# Patient Record
Sex: Male | Born: 1990 | Race: Black or African American | Hispanic: No | Marital: Married | Smoking: Never smoker
Health system: Southern US, Community
[De-identification: ages and names within clinical notes are randomized; demographics above are authoritative.]

## PROBLEM LIST (undated history)

## (undated) DIAGNOSIS — K2100 Gastro-esophageal reflux disease with esophagitis, without bleeding: Secondary | ICD-10-CM

## (undated) DIAGNOSIS — I10 Essential (primary) hypertension: Secondary | ICD-10-CM

## (undated) DIAGNOSIS — L309 Dermatitis, unspecified: Secondary | ICD-10-CM

## (undated) HISTORY — PX: KNEE ARTHROSCOPY: SHX127

## (undated) HISTORY — PX: HAND SURGERY: SHX662

---
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
## 2010-04-11 ENCOUNTER — Emergency Department (HOSPITAL_BASED_OUTPATIENT_CLINIC_OR_DEPARTMENT_OTHER)
Admission: EM | Admit: 2010-04-11 | Discharge: 2010-04-11 | Payer: Self-pay | Source: Home / Self Care | Admitting: Emergency Medicine

## 2010-09-07 NOTE — Op Note (Signed)
   NAME:  Gordon Taylor, Gordon Taylor                           ACCOUNT NO.:  [id number]   MEDICAL RECORD NO.:  [id number]                   PATIENT TYPE:  AMB   LOCATION:  [facility]                                  FACILITY:  [facility]   PHYSICIAN:  Artist Pais. Mina Marble, M.D.           DATE OF BIRTH:  09-09-1990   DATE OF PROCEDURE:  02/10/2002  DATE OF DISCHARGE:                                 OPERATIVE REPORT   PREOPERATIVE DIAGNOSIS:  Right fifth proximal phalangeal fracture.   POSTOPERATIVE DIAGNOSES:  Right fifth proximal phalangeal fracture.   PROCEDURE:  Closed reduction with percutaneous pinning of the above  fracture, using two #0.35 K-wires.   SURGEON:  Artist Pais. Mina Marble, M.D.   ASSISTANT:  Junius Roads. Bobbe Medico.   ANESTHESIA:  General.   TOURNIQUET TIME:  40 minutes.   COMPLICATIONS:  None.   DRAINS:  None.   DESCRIPTION OF PROCEDURE:  The patient was taken to the operating room.  Conduction adequate.  General anesthesia.  The right upper extremity was  prepped and draped in the usual sterile fashion.  The arm was elevated for 2-  1/2 minutes, and the tourniquet was then elevated to 225 mmHg. At this point  in time a closed reduction was performed of a displaced spiral fracture of  the right fifth proximal phalanx.  Once reduction was achieved, two #0.35 K-  wires were driven from proximal and distal in a crossed fashion across the  fracture site, thus securing the fracture in a reduced position.  The x-rays  intraoperatively showed a good reduction in the AP, lateral and oblique  views.  The K-wires were bent outside the skin.  The patient was then placed  in a sterile dressing of Xeroform, 4 x 4s, and an ulnar gutter splint.  The  patient also had 0.25% Marcaine injection for postoperative pain control.  The patient tolerated the procedure well and was taken to the recovery room  in stable condition.                                                Artist Pais Mina Marble, M.D.    MAW/MEDQ  D:  02/10/2002  T:  02/10/2002  Job:  [id number]

## 2013-01-11 ENCOUNTER — Encounter (HOSPITAL_BASED_OUTPATIENT_CLINIC_OR_DEPARTMENT_OTHER): Payer: Self-pay | Admitting: *Deleted

## 2013-01-11 ENCOUNTER — Emergency Department (HOSPITAL_BASED_OUTPATIENT_CLINIC_OR_DEPARTMENT_OTHER)
Admission: EM | Admit: 2013-01-11 | Discharge: 2013-01-11 | Disposition: A | Discharge status: Home / Self Care | Payer: No Typology Code available for payment source | Attending: Emergency Medicine | Admitting: Emergency Medicine

## 2013-01-11 ENCOUNTER — Emergency Department (HOSPITAL_BASED_OUTPATIENT_CLINIC_OR_DEPARTMENT_OTHER): Payer: No Typology Code available for payment source

## 2013-01-11 DIAGNOSIS — Y92410 Unspecified street and highway as the place of occurrence of the external cause: Secondary | ICD-10-CM | POA: Insufficient documentation

## 2013-01-11 DIAGNOSIS — S0003XA Contusion of scalp, initial encounter: Secondary | ICD-10-CM | POA: Insufficient documentation

## 2013-01-11 DIAGNOSIS — Y9389 Activity, other specified: Secondary | ICD-10-CM | POA: Insufficient documentation

## 2013-01-11 DIAGNOSIS — S0083XA Contusion of other part of head, initial encounter: Secondary | ICD-10-CM

## 2013-01-11 DIAGNOSIS — IMO0002 Reserved for concepts with insufficient information to code with codable children: Secondary | ICD-10-CM | POA: Insufficient documentation

## 2013-01-11 DIAGNOSIS — S6392XA Sprain of unspecified part of left wrist and hand, initial encounter: Secondary | ICD-10-CM

## 2013-01-11 DIAGNOSIS — Z872 Personal history of diseases of the skin and subcutaneous tissue: Secondary | ICD-10-CM | POA: Insufficient documentation

## 2013-01-11 DIAGNOSIS — S6390XA Sprain of unspecified part of unspecified wrist and hand, initial encounter: Secondary | ICD-10-CM | POA: Insufficient documentation

## 2013-01-11 HISTORY — DX: Dermatitis, unspecified: L30.9

## 2013-01-11 MED ORDER — IBUPROFEN 800 MG PO TABS: 800.0000 mg | ORAL_TABLET | Freq: Once | ORAL | Status: DC

## 2013-01-11 MED ORDER — CYCLOBENZAPRINE HCL 10 MG PO TABS: 10.0000 mg | ORAL_TABLET | Freq: Three times a day (TID) | ORAL | Status: DC | PRN

## 2013-01-11 MED ORDER — IBUPROFEN 800 MG PO TABS
800.0000 mg | ORAL_TABLET | Freq: Once | ORAL | Status: AC
  Administered 2013-01-11: 800 mg via ORAL
  Filled 2013-01-11: qty 1

## 2013-01-11 MED ORDER — CYCLOBENZAPRINE HCL 10 MG PO TABS
10.0000 mg | ORAL_TABLET | Freq: Once | ORAL | Status: DC
  Filled 2013-01-11: qty 1
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
## 2013-01-11 NOTE — ED Provider Notes (Signed)
Medical screening examination/treatment/procedure(s) were performed by non-physician practitioner and as supervising physician I was immediately available for consultation/collaboration.   Paidyn Mcferran, MD 01/11/13 1934 
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
## 2013-09-06 DIAGNOSIS — M1710 Unilateral primary osteoarthritis, unspecified knee: Secondary | ICD-10-CM | POA: Insufficient documentation

## 2014-03-07 DIAGNOSIS — L440 Pityriasis rubra pilaris: Secondary | ICD-10-CM | POA: Insufficient documentation

## 2014-06-18 ENCOUNTER — Emergency Department (HOSPITAL_BASED_OUTPATIENT_CLINIC_OR_DEPARTMENT_OTHER)
Admission: EM | Admit: 2014-06-18 | Discharge: 2014-06-18 | Disposition: A | Discharge status: Home / Self Care | Payer: BLUE CROSS/BLUE SHIELD | Attending: Emergency Medicine | Admitting: Emergency Medicine

## 2014-06-18 ENCOUNTER — Encounter (HOSPITAL_BASED_OUTPATIENT_CLINIC_OR_DEPARTMENT_OTHER): Payer: Self-pay | Admitting: *Deleted

## 2014-06-18 DIAGNOSIS — L440 Pityriasis rubra pilaris: Secondary | ICD-10-CM | POA: Diagnosis not present

## 2014-06-18 DIAGNOSIS — G44209 Tension-type headache, unspecified, not intractable: Secondary | ICD-10-CM

## 2014-06-18 DIAGNOSIS — R51 Headache: Secondary | ICD-10-CM | POA: Insufficient documentation

## 2014-06-18 MED ORDER — METOCLOPRAMIDE HCL 10 MG PO TABS: 10.0000 mg | ORAL_TABLET | Freq: Four times a day (QID) | ORAL | Status: DC | PRN

## 2014-06-18 MED ORDER — METOCLOPRAMIDE HCL 5 MG/ML IJ SOLN
10.0000 mg | Freq: Once | INTRAMUSCULAR | Status: AC
  Administered 2014-06-18: 10 mg via INTRAVENOUS
  Filled 2014-06-18: qty 2

## 2014-06-18 MED ORDER — SODIUM CHLORIDE 0.9 % IV SOLN
1000.0000 mL | Freq: Once | INTRAVENOUS | Status: AC
  Administered 2014-06-18: 1000 mL via INTRAVENOUS

## 2014-06-18 MED ORDER — SODIUM CHLORIDE 0.9 % IV SOLN: 1000.0000 mL | INTRAVENOUS | Status: DC

## 2014-06-18 MED ORDER — DIPHENHYDRAMINE HCL 50 MG/ML IJ SOLN
25.0000 mg | Freq: Once | INTRAMUSCULAR | Status: AC
  Administered 2014-06-18: 25 mg via INTRAVENOUS
  Filled 2014-06-18: qty 1

## 2014-06-18 NOTE — ED Provider Notes (Signed)
CSN: [id number]     Arrival date & time 06/18/14  0359 History   First MD Initiated Contact with Patient 06/18/14 [phone]     Chief Complaint  Patient presents with  . Headache     (Consider location/radiation/quality/duration/timing/severity/associated sxs/prior Treatment) Patient is a 24 y.o. male presenting with headaches. The history is provided by the patient.  Headache He comes in because of a headache which was present throughout the day yesterday, and then woke him up at 1 AM. Headache was severe and he rated it at 10/10 at its worst. It is located across the frontal region. He took 4 ibuprofen and headache has improved to where it is now only 6/10. Headache did seem to be worse when he was up and walking. There is no photophobia or phonophobia. He denies any visual change. He denies any nausea or vomiting and there is been no fever or chills. 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  Past Medical History  Diagnosis Date  . Eczema    Past Surgical History  Procedure Laterality Date  . Knee arthroscopy     No family history on file. History  Substance Use Topics  . Smoking status: Never Smoker   . Smokeless tobacco: Not on file  . Alcohol Use: No    Review of Systems  Neurological: Positive for headaches.  All other systems reviewed and are negative.     Allergies  Review of patient's allergies indicates no known allergies.  Home Medications   Prior to Admission medications   Medication Sig Start Date End Date Taking? Authorizing Provider  cyclobenzaprine (FLEXERIL) 10 MG tablet Take 1 tablet (10 mg total) by mouth 3 (three) times daily as needed for muscle spasms. 01/11/13   Shari A Upstill, PA-C  ibuprofen (ADVIL,MOTRIN) 800 MG tablet Take 1 tablet (800 mg total) by mouth once. 01/11/13   Shari A Upstill, PA-C   BP 148/71 mmHg  Pulse 77  Temp(Src) 97.8 F (36.6 C) (Oral)  Resp 20  Ht  (1.803 m)  Wt 285 lb (129.275  kg)  BMI 39.77 kg/m2  SpO2 99% Physical Exam  Nursing note and vitals reviewed.  24 year old male, resting comfortably and in no acute distress. Vital signs are significant for mild hypertension. Oxygen saturation is 99%, which is normal. Head is normocephalic and atraumatic. PERRLA, EOMI. Oropharynx is clear. Fundi show no hemorrhage, exudate, or papilledema. There is moderate tenderness to palpation of the temporalis muscles bilaterally. Neck is nontender and supple without adenopathy or JVD. Back is nontender and there is no CVA tenderness. Lungs are clear without rales, wheezes, or rhonchi. Chest is nontender. Heart has regular rate and rhythm without murmur. Abdomen is soft, flat, nontender without masses or hepatosplenomegaly and peristalsis is normoactive. Extremities have no cyanosis or pitting edema, full range of motion is present. Skin is warm and dry. There is erythema and slight swelling of the hands with some desquamation. 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 Neurologic: Mental status is normal, cranial nerves are intact, there are no motor or sensory deficits.  ED Course  Procedures (including critical care time)  MDM   Final diagnoses:  Muscle contraction headache  Pityriasis rubra pilaris    Headache of uncertain cause. No red flags to indicate serious causes of headache. He will be treated with a migraine cocktail of IV fluids, metoclopramide, and diphenhydramine and reassessed.  He had excellent relief of his headache with  above noted treatment. He is discharged with prescription for metoclopramide. He is advised of interaction between NSAIDs and methotrexate, and advised to use acetaminophen for pain rather than NSAIDs.  Dione Boozeavid Kito Cuffe, MD 06/18/14 [phone]

## 2014-06-18 NOTE — ED Notes (Signed)
MD at bedside. 

## 2014-06-18 NOTE — Discharge Instructions (Signed)
Do not take ibuprofen or naproxen - they can raise the level of methotrexate in your blood stream.  Tension Headache A tension headache is a feeling of pain, pressure, or aching often felt over the front and sides of the head. The pain can be dull or can feel tight (constricting). It is the most common type of headache. Tension headaches are not normally associated with nausea or vomiting and do not get worse with physical activity. Tension headaches can last 30 minutes to several days.  CAUSES  The exact cause is not known, but it may be caused by chemicals and hormones in the brain that lead to pain. Tension headaches often begin after stress, anxiety, or depression. Other triggers may include:  Alcohol.  Caffeine (too much or withdrawal).  Respiratory infections (colds, flu, sinus infections).  Dental problems or teeth clenching.  Fatigue.  Holding your head and neck in one position too long while using a computer. SYMPTOMS   Pressure around the head.   Dull, aching head pain.   Pain felt over the front and sides of the head.   Tenderness in the muscles of the head, neck, and shoulders. DIAGNOSIS  A tension headache is often diagnosed based on:   Symptoms.   Physical examination.   A CT scan or MRI of your head. These tests may be ordered if symptoms are severe or unusual. TREATMENT  Medicines may be given to help relieve symptoms.  HOME CARE INSTRUCTIONS   Only take over-the-counter or prescription medicines for pain or discomfort as directed by your caregiver.   Lie down in a dark, quiet room when you have a headache.   Keep a journal to find out what may be triggering your headaches. For example, write down:  What you eat and drink.  How much sleep you get.  Any change to your diet or medicines.  Try massage or other relaxation techniques.   Ice packs or heat applied to the head and neck can be used. Use these 3 to 4 times per day for 15 to 20  minutes each time, or as needed.   Limit stress.   Sit up straight, and do not tense your muscles.   Quit smoking if you smoke.  Limit alcohol use.  Decrease the amount of caffeine you drink, or stop drinking caffeine.  Eat and exercise regularly.  Get 7 to 9 hours of sleep, or as recommended by your caregiver.  Avoid excessive use of pain medicine as recurrent headaches can occur.  SEEK MEDICAL CARE IF:   You have problems with the medicines you were prescribed.  Your medicines do not work.  You have a change from the usual headache.  You have nausea or vomiting. SEEK IMMEDIATE MEDICAL CARE IF:   Your headache becomes severe.  You have a fever.  You have a stiff neck.  You have loss of vision.  You have muscular weakness or loss of muscle control.  You lose your balance or have trouble walking.  You feel faint or pass out.  You have severe symptoms that are different from your first symptoms. MAKE SURE YOU:   Understand these instructions.  Will watch your condition.  Will get help right away if you are not doing well or get worse. Document Released: 04/08/2005 Document Revised: 07/01/2011 Document Reviewed: 03/29/2011 [facility] Patient Information 2015 [place], [place]. This information is not intended to replace advice given to you by your health care provider. Make sure you discuss any questions you  have with your health care provider.  Metoclopramide tablets What is this medicine? METOCLOPRAMIDE (met oh kloe PRA mide) is used to treat the symptoms of gastroesophageal reflux disease (GERD) like heartburn. It is also used to treat people with slow emptying of the stomach and intestinal tract. This medicine may be used for other purposes; ask your health care provider or pharmacist if you have questions. COMMON BRAND NAME(S): Reglan What should I tell my health care provider before I take this medicine? They need to know if you have any of these  conditions: -breast cancer -depression -diabetes -heart failure -high blood pressure -kidney disease -liver disease -Parkinson's disease or a movement disorder -pheochromocytoma -seizures -stomach obstruction, bleeding, or perforation -an unusual or allergic reaction to metoclopramide, procainamide, sulfites, other medicines, foods, dyes, or preservatives -pregnant or trying to get pregnant -breast-feeding How should I use this medicine? Take this medicine by mouth with a glass of water. Follow the directions on the prescription label. Take this medicine on an empty stomach, about 30 minutes before eating. Take your doses at regular intervals. Do not take your medicine more often than directed. Do not stop taking except on the advice of your doctor or health care professional. A special MedGuide will be given to you by the pharmacist with each prescription and refill. Be sure to read this information carefully each time. Talk to your pediatrician regarding the use of this medicine in children. Special care may be needed. Overdosage: If you think you have taken too much of this medicine contact a poison control center or emergency room at once. NOTE: This medicine is only for you. Do not share this medicine with others. What if I miss a dose? If you miss a dose, take it as soon as you can. If it is almost time for your next dose, take only that dose. Do not take double or extra doses. What may interact with this medicine? -acetaminophen -cyclosporine -digoxin -medicines for blood pressure -medicines for diabetes, including insulin -medicines for hay fever and other allergies -medicines for depression, especially an Monoamine Oxidase Inhibitor (MAOI) -medicines for Parkinson's disease, like levodopa -medicines for sleep or for pain -tetracycline This list may not describe all possible interactions. Give your health care provider a list of all the medicines, herbs, non-prescription  drugs, or dietary supplements you use. Also tell them if you smoke, drink alcohol, or use illegal drugs. Some items may interact with your medicine. What should I watch for while using this medicine? It may take a few weeks for your stomach condition to start to get better. However, do not take this medicine for longer than 12 weeks. The longer you take this medicine, and the more you take it, the greater your chances are of developing serious side effects. If you are an elderly patient, a male patient, or you have diabetes, you may be at an increased risk for side effects from this medicine. Contact your doctor immediately if you start having movements you cannot control such as lip smacking, rapid movements of the tongue, involuntary or uncontrollable movements of the eyes, head, arms and legs, or muscle twitches and spasms. Patients and their families should watch out for worsening depression or thoughts of suicide. Also watch out for any sudden or severe changes in feelings such as feeling anxious, agitated, panicky, irritable, hostile, aggressive, impulsive, severely restless, overly excited and hyperactive, or not being able to sleep. If this happens, especially at the beginning of treatment or after a change in  dose, call your doctor. Do not treat yourself for high fever. Ask your doctor or health care professional for advice. You may get drowsy or dizzy. Do not drive, use machinery, or do anything that needs mental alertness until you know how this drug affects you. Do not stand or sit up quickly, especially if you are an older patient. This reduces the risk of dizzy or fainting spells. Alcohol can make you more drowsy and dizzy. Avoid alcoholic drinks. What side effects may I notice from receiving this medicine? Side effects that you should report to your doctor or health care professional as soon as possible: -allergic reactions like skin rash, itching or hives, swelling of the face, lips, or  tongue -abnormal production of milk in females -breast enlargement in both males and females -change in the way you walk -difficulty moving, speaking or swallowing -drooling, lip smacking, or rapid movements of the tongue -excessive sweating -fever -involuntary or uncontrollable movements of the eyes, head, arms and legs -irregular heartbeat or palpitations -muscle twitches and spasms -unusually weak or tired Side effects that usually do not require medical attention (report to your doctor or health care professional if they continue or are bothersome): -change in sex drive or performance -depressed mood -diarrhea -difficulty sleeping -headache -menstrual changes -restless or nervous This list may not describe all possible side effects. Call your doctor for medical advice about side effects. You may report side effects to FDA at 1-800-FDA-1088. Where should I keep my medicine? Keep out of the reach of children. Store at room temperature between 20 and 25 degrees C (68 and 77 degrees F). Protect from light. Keep container tightly closed. Throw away any unused medicine after the expiration date. NOTE: This sheet is a summary. It may not cover all possible information. If you have questions about this medicine, talk to your doctor, pharmacist, or health care provider.  2015, [employer]vier/[employer] Standard. (2011-08-06 13:04:38)

## 2014-06-18 NOTE — ED Notes (Signed)
Pt reports headache that started yesterday and woke him from his sleep tonight.

## 2016-03-21 ENCOUNTER — Ambulatory Visit: Payer: BLUE CROSS/BLUE SHIELD

## 2016-03-21 ENCOUNTER — Ambulatory Visit (INDEPENDENT_AMBULATORY_CARE_PROVIDER_SITE_OTHER): Payer: BLUE CROSS/BLUE SHIELD | Admitting: Physician Assistant

## 2016-03-21 VITALS — BP 162/86 | HR 118 | Temp 98.3°F | Resp 18 | Ht 72.6 in | Wt 345.1 lb

## 2016-03-21 DIAGNOSIS — H66003 Acute suppurative otitis media without spontaneous rupture of ear drum, bilateral: Secondary | ICD-10-CM | POA: Diagnosis not present

## 2016-03-21 DIAGNOSIS — R030 Elevated blood-pressure reading, without diagnosis of hypertension: Secondary | ICD-10-CM | POA: Diagnosis not present

## 2016-03-21 MED ORDER — AMOXICILLIN 875 MG PO TABS: 875.0000 mg | ORAL_TABLET | Freq: Two times a day (BID) | ORAL | 0 refills | Status: AC

## 2016-03-21 NOTE — Progress Notes (Signed)
Patient ID: Gordon Taylor, male     DOB: 05-13-1990, 25 y.o.    MRN: [id number]  PCP: Katy ApoPOLITE,RONALD D, MD  Chief Complaint  Patient presents with  . Nasal Congestion    X2 Days  . Tinnitus    Both ears X2 days    Subjective:   This patient is new to this patient and presents for evaluation of bilateral ear pain and hearing loss x 2 days.  Assoiated nasal/sinus congestion, fatigue/malaise, cough productive of yellow sputum and sore throat. Denies tinnitus. No SOB, CP, HA.  OTC Mucinex without relief.  Recent contact with a child with a viral URI. Has not received the seasonal influenza vaccine.  Review of Systems Constitutional: Positive for fatigue. Negative for appetite change, chills, diaphoresis and fever.  HENT: Positive for congestion, ear pain, hearing loss and sore throat. Negative for ear discharge, rhinorrhea, sinus pain, sinus pressure and tinnitus.   Eyes: Positive for redness. Negative for pain, discharge and itching.  Respiratory: Positive for cough. Negative for chest tightness, shortness of breath and wheezing.   Cardiovascular: Negative for chest pain and palpitations.  Gastrointestinal: Negative for abdominal pain, blood in stool, constipation, diarrhea, nausea and vomiting.  Genitourinary: Negative for difficulty urinating, dysuria, frequency, hematuria and urgency.  Neurological: Negative for dizziness, light-headedness and headaches.     Prior to Admission medications   Medication Sig Start Date End Date Taking? Authorizing Provider  RANITIDINE HCL PO Take by mouth.    Historical Provider, MD     No Known Allergies   Patient Active Problem List   Diagnosis Date Noted  . 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 Primary localized osteoarthrosis, lower leg 09/06/2013     Family History  Problem Relation Age of Onset  . Diabetes Father      Social History   Social History  . Marital status: Single   Spouse name: N/A  . Number of children: N/A  . Years of education: N/A   Occupational History  . Not on file.   Social History Main Topics  . Smoking status: Never Smoker  . Smokeless tobacco: Never Used  . Alcohol use No  . Drug use: No  . Sexual activity: No   Other Topics Concern  . Not on file   Social History Narrative  . No narrative on file         Objective:  Physical Exam  Constitutional: He is oriented to person, place, and time. He appears well-developed and well-nourished. He is active and cooperative. No distress.  BP (!) 162/86   Pulse (!) 118   Temp 98.3 F (36.8 C) (Oral)   Resp 18   Ht 6' 0.6" (1.844 m)   Wt (!) 345 lb 1.3 oz (156.5 kg)   SpO2 99%   BMI 46.03 kg/m  Repeat pulse is 76   HENT:  Head: Normocephalic and atraumatic.  Right Ear: External ear and ear canal normal. No lacerations. No drainage, swelling or tenderness. No foreign bodies. Tympanic membrane is injected and bulging. No hemotympanum. Decreased hearing is noted.  Left Ear: External ear and ear canal normal. No lacerations. No drainage, swelling or tenderness. No foreign bodies. Tympanic membrane is injected and bulging. No hemotympanum. Decreased hearing is noted.  Eyes: Conjunctivae are normal.  Pulmonary/Chest: Effort normal.  Neurological: He is alert and oriented to person, place, and time.  Psychiatric: He has a normal mood and affect. His speech is normal and behavior is  normal.             Assessment & Plan:  1. Acute suppurative otitis media of both ears without spontaneous rupture of tympanic membranes, recurrence not specified Amoxicillin. Rest. Hydrate. Anticipatory guidance.  2. Elevated BP without diagnosis of hypertension RTC 1-2 weeks for re-evaluation. If remains elevated, plan EKG, CBC, CMET, UA dip+micro, TSH and initiation of antihypertensive.   Fernande Brashelle S. Teagen Mcleary, PA-C Physician Assistant-Certified [facility]

## 2016-03-21 NOTE — Patient Instructions (Addendum)
     IF you received an x-ray today, you will receive an invoice from [facility]. Please contact  Radiology at [phone] with questions or concerns regarding your invoice.   IF you received labwork today, you will receive an invoice from [facility]. Please contact [facility] at [phone] with questions or concerns regarding your invoice.   Our billing staff will not be able to assist you with questions regarding bills from these companies.  You will be contacted with the lab results as soon as they are available. The fastest way to get your results is to activate your My Chart account. Instructions are located on the last page of this paperwork. If you have not heard from us regarding the results in 2 weeks, please contact this office.      Otitis Media, Adult Otitis media is redness, soreness, and inflammation of the middle ear. Otitis media may be caused by allergies or, most commonly, by infection. Often it occurs as a complication of the common cold. What are the signs or symptoms? Symptoms of otitis media may include:  Earache.  Fever.  Ringing in your ear.  Headache.  Leakage of fluid from the ear. How is this diagnosed? To diagnose otitis media, your health care provider will examine your ear with an otoscope. This is an instrument that allows your health care provider to see into your ear in order to examine your eardrum. Your health care provider also will ask you questions about your symptoms. How is this treated? Typically, otitis media resolves on its own within 3-5 days. Your health care provider may prescribe medicine to ease your symptoms of pain. If otitis media does not resolve within 5 days or is recurrent, your health care provider may prescribe antibiotic medicines if he or she suspects that a bacterial infection is the cause. Follow these instructions at home:  If you were prescribed an antibiotic medicine,  finish it all even if you start to feel better.  Take medicines only as directed by your health care provider.  Keep all follow-up visits as directed by your health care provider. Contact a health care provider if:  You have otitis media only in one ear, or bleeding from your nose, or both.  You notice a lump on your neck.  You are not getting better in 3-5 days.  You feel worse instead of better. Get help right away if:  You have pain that is not controlled with medicine.  You have swelling, redness, or pain around your ear or stiffness in your neck.  You notice that part of your face is paralyzed.  You notice that the bone behind your ear (mastoid) is tender when you touch it. This information is not intended to replace advice given to you by your health care provider. Make sure you discuss any questions you have with your health care provider. Document Released: 01/12/2004 Document Revised: 09/14/2015 Document Reviewed: 11/03/2012 Elsevier Interactive Patient Education  2017 [employer]  

## 2016-03-21 NOTE — Progress Notes (Signed)
Subjective:    Patient ID: Gordon Taylor, male    DOB: May 28, 1990, 25 y.o.   MRN: [id number]  Chief Complaint  Patient presents with  . Nasal Congestion    X2 Days  . Tinnitus    Both ears X2 days   HPI: Presents for symptoms which began 2 days ago with nasal congestion. States it began in his right nare in the morning and progressed to both nares being congestion by that evening. States yesterday morning he woke up with fullness in his ears causing difficulty hearing. Denies ear pain, ear discharge or tinnitus. Associated symptoms include malaise, fatigue, dry mouth, productive cough with yellow sputum which began this morning and sore throat. Notes some increased redness of his eyes but denies itching or discharge. Denies fevers, chills, headaches,  SOB, wheezing, chest pain or tightness, or palpitations. States he has been using Mucinex at home which has provided a little relief. Denies receiving flu vaccine. States he was recently around a child who had a cold. Denies smoking history.  Denies abdominal pain, nausea, vomiting, diarrhea, or constipation. Denies urinary complaints.   Review of Systems  Constitutional: Positive for fatigue. Negative for appetite change, chills, diaphoresis and fever.  HENT: Positive for congestion, ear pain, hearing loss and sore throat. Negative for ear discharge, rhinorrhea, sinus pain, sinus pressure and tinnitus.   Eyes: Positive for redness. Negative for pain, discharge and itching.  Respiratory: Positive for cough. Negative for chest tightness, shortness of breath and wheezing.   Cardiovascular: Negative for chest pain and palpitations.  Gastrointestinal: Negative for abdominal pain, blood in stool, constipation, diarrhea, nausea and vomiting.  Genitourinary: Negative for difficulty urinating, dysuria, frequency, hematuria and urgency.  Neurological: Negative for dizziness, light-headedness and headaches.   No Known Allergies  There are no active  problems to display for this patient.  Prior to Admission medications   Not on File       Objective: Blood pressure (!) 160/80, pulse (!) 118, temperature 98.3 F (36.8 C), temperature source Oral, resp. rate 18, height 6' 0.6" (1.844 m), weight (!) 345 lb 1.3 oz (156.5 kg), SpO2 99 %.   Physical Exam  Constitutional: He is oriented to person, place, and time. He appears well-developed and well-nourished. No distress.  HENT:  Head: Normocephalic and atraumatic.  Right Ear: External ear normal. No drainage, swelling or tenderness. No mastoid tenderness. Tympanic membrane is injected and bulging. Tympanic membrane is not scarred, not perforated, not erythematous and not retracted. No middle ear effusion. No hemotympanum.  Left Ear: External ear normal. No drainage, swelling or tenderness. No mastoid tenderness. Tympanic membrane is injected and bulging. Tympanic membrane is not scarred, not perforated, not erythematous and not retracted.  No middle ear effusion. No hemotympanum.  Mouth/Throat: No oropharyngeal exudate.  Bilateral opaque TMs.  Eyes: Conjunctivae are normal. Pupils are equal, round, and reactive to light. Right eye exhibits no discharge. Left eye exhibits no discharge. Right conjunctiva is not injected. Left conjunctiva is not injected. No scleral icterus. Right pupil is round and reactive. Left pupil is round and reactive. Pupils are equal.  Neck: Normal range of motion. Neck supple. No tracheal tenderness and no spinous process tenderness present. No tracheal deviation and no edema present. No thyromegaly present.  Cardiovascular: Normal rate, regular rhythm, S1 normal, S2 normal, normal heart sounds and intact distal pulses.  Exam reveals no gallop, no distant heart sounds and no friction rub.   No murmur heard. Pulses:  Radial pulses are 2+ on the right side, and 2+ on the left side.  Pulmonary/Chest: Effort normal and breath sounds normal. No accessory muscle usage or  stridor. No respiratory distress. He has no decreased breath sounds. He has no wheezes. He has no rhonchi. He has no rales.  Lymphadenopathy:       Head (right side): Submandibular adenopathy present. No submental, no tonsillar, no preauricular and no posterior auricular adenopathy present.       Head (left side): Submandibular adenopathy present. No submental, no tonsillar, no preauricular and no posterior auricular adenopathy present.    He has cervical adenopathy.  Neurological: He is alert and oriented to person, place, and time.  Skin: Skin is warm and dry. No rash noted. He is not diaphoretic. No erythema.     Psychiatric: He has a normal mood and affect. His behavior is normal.       Assessment & Plan:  1. Acute suppurative otitis media of both ears without spontaneous rupture of tympanic membranes, recurrence not specified Rx Amoxicillin for bilateral AOM. Recommended rest and fluids. Advised patient to RTC if symptoms worsen or do not improve.  2. Elevated BP without diagnosis of hypertension Recheck of blood pressure measurement revealed 162/98 mmHg. Pulse decreased from 118 bpm to 78 bpm on re-evaluation. Elevated BP likely due to illness. Advised patient to RTC in 1 week to re-check blood pressure.

## 2016-03-22 ENCOUNTER — Telehealth: Payer: Self-pay

## 2016-03-22 NOTE — Telephone Encounter (Signed)
Pt came into clinic requesting note for work through today as symptoms have not gotten any better.

## 2016-03-22 NOTE — Progress Notes (Signed)
Patient walked in still feeling ill and asked for a work note for today. Given by me based on ov note.

## 2016-03-28 ENCOUNTER — Ambulatory Visit: Payer: BLUE CROSS/BLUE SHIELD

## 2017-03-09 ENCOUNTER — Encounter (HOSPITAL_BASED_OUTPATIENT_CLINIC_OR_DEPARTMENT_OTHER): Payer: Self-pay | Admitting: *Deleted

## 2017-03-09 ENCOUNTER — Emergency Department (HOSPITAL_BASED_OUTPATIENT_CLINIC_OR_DEPARTMENT_OTHER): Payer: BLUE CROSS/BLUE SHIELD

## 2017-03-09 ENCOUNTER — Other Ambulatory Visit: Payer: Self-pay

## 2017-03-09 ENCOUNTER — Emergency Department (HOSPITAL_BASED_OUTPATIENT_CLINIC_OR_DEPARTMENT_OTHER)
Admission: EM | Admit: 2017-03-09 | Discharge: 2017-03-09 | Disposition: A | Discharge status: Home / Self Care | Payer: BLUE CROSS/BLUE SHIELD | Attending: Emergency Medicine | Admitting: Emergency Medicine

## 2017-03-09 DIAGNOSIS — G44209 Tension-type headache, unspecified, not intractable: Secondary | ICD-10-CM | POA: Insufficient documentation

## 2017-03-09 DIAGNOSIS — Z79899 Other long term (current) drug therapy: Secondary | ICD-10-CM | POA: Insufficient documentation

## 2017-03-09 DIAGNOSIS — R51 Headache: Secondary | ICD-10-CM | POA: Diagnosis present

## 2017-03-09 HISTORY — DX: Gastro-esophageal reflux disease with esophagitis: K21.0

## 2017-03-09 HISTORY — DX: Gastro-esophageal reflux disease with esophagitis, without bleeding: K21.00

## 2017-03-09 MED ORDER — KETOROLAC TROMETHAMINE 60 MG/2ML IM SOLN
60.0000 mg | Freq: Once | INTRAMUSCULAR | Status: AC
  Administered 2017-03-09: 60 mg via INTRAMUSCULAR
  Filled 2017-03-09: qty 2

## 2017-03-09 MED ORDER — BUTALBITAL-APAP-CAFFEINE 50-325-40 MG PO TABS: 1.0000 | ORAL_TABLET | Freq: Three times a day (TID) | ORAL | 0 refills | Status: AC | PRN

## 2017-03-09 MED ORDER — CYCLOBENZAPRINE HCL 10 MG PO TABS
10.0000 mg | ORAL_TABLET | Freq: Once | ORAL | Status: AC
Start: 2017-03-09 — End: 2017-03-09
  Administered 2017-03-09: 10 mg via ORAL
  Filled 2017-03-09: qty 1

## 2017-03-09 MED ORDER — CYCLOBENZAPRINE HCL 10 MG PO TABS: 10.0000 mg | ORAL_TABLET | Freq: Three times a day (TID) | ORAL | 0 refills | Status: DC | PRN

## 2017-03-09 NOTE — Discharge Instructions (Signed)
Try the Flexeril to help with muscle tension headaches.  If your headache worsens, you can take the Fioricet. Please note that this can be sedating, so do not take this while working, operating heavy machinery, or driving.

## 2017-03-09 NOTE — ED Triage Notes (Signed)
Patient reports a five day history of headache.  States he has taken ibuprofen with minimal relief.

## 2017-03-09 NOTE — ED Provider Notes (Signed)
[facility] EMERGENCY DEPARTMENT Provider Note   CSN: [id number] Arrival date & time: 03/09/17  [id number]     History   Chief Complaint Chief Complaint  Patient presents with  . Headache    HPI Devian Evern Bio Pewitt is a 26 y.o. male.  HPI   26 year old male with past medical history of obesity who presents with headache.  The patient states that over the last 5 days, he has had gradual onset of progressively worsening frontal headache.  He describes it as an aching, throbbing sensation.  It is worse in the mornings and then improves throughout the day.  He does have a history of suspected sleep apnea, but has not had the results of his recent sleep test.  He denies any fevers or chills.  No radiation to the neck.  No photophobia.  He does feel mild pressure in his eyes and his notices blood pressures been elevated.  Denies any chest pain or shortness of breath.  No focal numbness or weakness.  No night loss or weight sweats.  He does have a history of tension headache, but states this is worse than his usual headache.  Past Medical History:  Diagnosis Date  . Eczema   . Reflux esophagitis     Patient Active Problem List   Diagnosis Date Noted  . 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 Primary localized osteoarthrosis, lower leg 09/06/2013    Past Surgical History:  Procedure Laterality Date  . HAND SURGERY    . KNEE ARTHROSCOPY         Home Medications    Prior to Admission medications   Medication Sig Start Date End Date Taking? Authorizing Provider  ibuprofen (ADVIL,MOTRIN) 600 MG tablet Take 600 mg every 6 (six) hours as needed by mouth.   Yes [provider]  butalbital-acetaminophen-caffeine (FIORICET, ESGIC) 50-325-40 MG tablet Take 1-2 tablets every 8 (eight) hours as needed by mouth for headache. 03/09/17 03/09/18  Shaune PollackIsaacs, Ziyad Dyar, MD  cyclobenzaprine (FLEXERIL) 10 MG tablet Take 1 tablet (10 mg total) 3 (three) times  daily as needed by mouth for muscle spasms (with headache). 03/09/17   Shaune PollackIsaacs, Laconda Basich, MD  RANITIDINE HCL PO Take by mouth.    [provider]    Family History Family History  Problem Relation Age of Onset  . Diabetes Father     Social History Social History   Tobacco Use  . Smoking status: Never Smoker  . Smokeless tobacco: Never Used  Substance Use Topics  . Alcohol use: No  . Drug use: No     Allergies   Patient has no known allergies.   Review of Systems Review of Systems  Constitutional: Positive for fatigue. Negative for chills and fever.  HENT: Negative for congestion and rhinorrhea.   Eyes: Negative for visual disturbance.  Respiratory: Negative for cough, shortness of breath and wheezing.   Cardiovascular: Negative for chest pain and leg swelling.  Gastrointestinal: Negative for abdominal pain, diarrhea, nausea and vomiting.  Genitourinary: Negative for dysuria and flank pain.  Musculoskeletal: Negative for neck pain and neck stiffness.  Skin: Negative for rash and wound.  Allergic/Immunologic: Negative for immunocompromised state.  Neurological: Positive for headaches. Negative for syncope and weakness.  All other systems reviewed and are negative.    Physical Exam Updated Vital Signs BP (!) 160/104 (BP Location: Right Arm)   Pulse 100   Temp 98.1 F (36.7 C) (Oral)   Resp 20   Ht 6' (  1.829 m)   Wt (!) 158.8 kg (350 lb)   SpO2 99%   BMI 47.47 kg/m   Physical Exam  Constitutional: He is oriented to person, place, and time. He appears well-developed and well-nourished. No distress.  HENT:  Head: Normocephalic and atraumatic.  Eyes: Conjunctivae are normal.  Neck: Neck supple.  No meningismus.  Neck is supple.  Cardiovascular: Normal rate, regular rhythm and normal heart sounds. Exam reveals no friction rub.  No murmur heard. Pulmonary/Chest: Effort normal and breath sounds normal. No respiratory distress. He has no wheezes. He has no  rales.  Abdominal: He exhibits no distension.  Musculoskeletal: He exhibits no edema.  Neurological: He is alert and oriented to person, place, and time. He exhibits normal muscle tone.  Skin: Skin is warm. Capillary refill takes less than 2 seconds.  Psychiatric: He has a normal mood and affect.  Nursing note and vitals reviewed.   Neurological Exam:  Mental Status: Alert and oriented to person, place, and time. Attention and concentration normal. Speech clear. Recent memory is intact. Cranial Nerves: Visual fields grossly intact. EOMI and PERRLA. No nystagmus noted. Facial sensation intact at forehead, maxillary cheek, and chin/mandible bilaterally. No facial asymmetry or weakness. Hearing grossly normal. Uvula is midline, and palate elevates symmetrically. Normal SCM and trapezius strength. Tongue midline without fasciculations. Motor: Muscle strength 5/5 in proximal and distal UE and LE bilaterally. No pronator drift. Muscle tone normal. Reflexes: 2+ and symmetrical in all four extremities.  Sensation: Intact to light touch in upper and lower extremities distally bilaterally.  Gait: Normal without ataxia. Coordination: Normal FTN bilaterally.      ED Treatments / Results  Labs (all labs ordered are listed, but only abnormal results are displayed) Labs Reviewed - No data to display  EKG  EKG Interpretation None       Radiology Ct Head Wo Contrast  Result Date: 03/09/2017 CLINICAL DATA:  Headache, nausea. EXAM: CT HEAD WITHOUT CONTRAST TECHNIQUE: Contiguous axial images were obtained from the base of the skull through the vertex without intravenous contrast. COMPARISON:  None FINDINGS: Brain: No acute intracranial abnormality. Specifically, no hemorrhage, hydrocephalus, mass lesion, acute infarction, or significant intracranial injury. Vascular: No hyperdense vessel or unexpected calcification. Skull: No acute calvarial abnormality. Sinuses/Orbits: Visualized paranasal sinuses  and mastoids clear. Orbital soft tissues unremarkable. Other: None IMPRESSION: Normal study. Electronically Signed   By: Charlett Nose M.D.   On: 03/09/2017 08:44    Procedures Procedures (including critical care time)  Medications Ordered in ED Medications  ketorolac (TORADOL) injection 60 mg (60 mg Intramuscular Given 03/09/17 0804)  cyclobenzaprine (FLEXERIL) tablet 10 mg (10 mg Oral Given 03/09/17 0804)     Initial Impression / Assessment and Plan / ED Course  I have reviewed the triage vital signs and the nursing notes.  Pertinent labs & imaging results that were available during my care of the patient were reviewed by me and considered in my medical decision making (see chart for details).     26 yo obese male here with dull, aching, central pressure like headache in tension distribution. History of tension HA. 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 He has no focal neuro deficits to suggest CVA or intracranial mass. CT head is neg. HA began gradually and is not c/f SAH. No fever, photophobia, neck stiffness, or sx of meningitis or encephalitis. He does have a significant muscular component as well. Will trial flexeril, d/c with outpt follow-up. Encouraged him to f/u with a PCP re: his  HTN. BP improved with pain control here. No signs of HTN emergency.  Final Clinical Impressions(s) / ED Diagnoses   Final diagnoses:  Tension headache    ED Discharge Orders        Ordered    cyclobenzaprine (FLEXERIL) 10 MG tablet  3 times daily PRN     03/09/17 0845    butalbital-acetaminophen-caffeine (FIORICET, ESGIC) 50-325-40 MG tablet  Every 8 hours PRN     03/09/17 0845       Shaune PollackIsaacs, Kissy Cielo, MD 03/09/17 [phone]

## 2018-12-18 ENCOUNTER — Emergency Department: Payer: Federal, State, Local not specified - PPO

## 2018-12-18 ENCOUNTER — Encounter: Payer: Self-pay | Admitting: Emergency Medicine

## 2018-12-18 ENCOUNTER — Other Ambulatory Visit: Payer: Self-pay

## 2018-12-18 DIAGNOSIS — R0789 Other chest pain: Secondary | ICD-10-CM | POA: Insufficient documentation

## 2018-12-18 DIAGNOSIS — R739 Hyperglycemia, unspecified: Secondary | ICD-10-CM | POA: Insufficient documentation

## 2018-12-18 LAB — CBC
HCT: 38.6 % — ABNORMAL LOW (ref 39.0–52.0)
Hemoglobin: 12.8 g/dL — ABNORMAL LOW (ref 13.0–17.0)
MCH: 27.8 pg (ref 26.0–34.0)
MCHC: 33.2 g/dL (ref 30.0–36.0)
MCV: 83.7 fL (ref 80.0–100.0)
Platelets: 412 10*3/uL — ABNORMAL HIGH (ref 150–400)
RBC: 4.61 MIL/uL (ref 4.22–5.81)
RDW: 13.2 % (ref 11.5–15.5)
WBC: 9.6 10*3/uL (ref 4.0–10.5)
nRBC: 0 % (ref 0.0–0.2)

## 2018-12-18 LAB — BASIC METABOLIC PANEL
Anion gap: 12 (ref 5–15)
BUN: 12 mg/dL (ref 6–20)
CO2: 25 mmol/L (ref 22–32)
Calcium: 9.4 mg/dL (ref 8.9–10.3)
Chloride: 101 mmol/L (ref 98–111)
Creatinine, Ser: 0.74 mg/dL (ref 0.61–1.24)
GFR calc Af Amer: >60 mL/min (ref >60)
GFR calc non Af Amer: >60 mL/min (ref >60)
Glucose, Bld: 177 mg/dL — ABNORMAL HIGH (ref 70–99)
Potassium: 3.6 mmol/L (ref 3.5–5.1)
Sodium: 138 mmol/L (ref 135–145)

## 2018-12-18 LAB — TROPONIN I (HIGH SENSITIVITY): Troponin I (High Sensitivity): 8 ng/L (ref <18)

## 2018-12-18 NOTE — ED Triage Notes (Signed)
Patient with complaint of upper back pain radiating to his chest that started last night and become worse today. Patient states that he also has shortness of breath.

## 2018-12-19 ENCOUNTER — Other Ambulatory Visit: Payer: Self-pay

## 2018-12-19 ENCOUNTER — Emergency Department
Admission: EM | Admit: 2018-12-19 | Discharge: 2018-12-19 | Disposition: A | Discharge status: Home / Self Care | Payer: Federal, State, Local not specified - PPO | Attending: Emergency Medicine | Admitting: Emergency Medicine

## 2018-12-19 ENCOUNTER — Emergency Department: Payer: Federal, State, Local not specified - PPO

## 2018-12-19 DIAGNOSIS — R7309 Other abnormal glucose: Secondary | ICD-10-CM

## 2018-12-19 DIAGNOSIS — R079 Chest pain, unspecified: Secondary | ICD-10-CM

## 2018-12-19 LAB — TROPONIN I (HIGH SENSITIVITY): Troponin I (High Sensitivity): 8 ng/L (ref <18)

## 2018-12-19 MED ORDER — IOHEXOL 350 MG/ML SOLN
125.0000 mL | Freq: Once | INTRAVENOUS | Status: AC | PRN
  Administered 2018-12-19: 04:00:00 125 mL via INTRAVENOUS

## 2018-12-19 NOTE — ED Provider Notes (Signed)
[facility] Emergency Department Provider Note  ____________________________________________  Time seen: Approximately 2:45 AM  I have reviewed the triage vital signs and the nursing notes.   HISTORY  Chief Complaint Chest Pain   HPI Gordon Taylor is a 28 y.o. male with a history of morbidly obesity, eczema and pityriasis rubra who presents for evaluation of chest pain.  Patient reports the pain started last night and became worse today.  The pain started in the middle of his upper back and now radiates across to his chest.  The pain is sharp and present with deep inspiration.  Pain is moderate in intensity.  Associated with shortness of breath.   No personal or family history of heart attacks, PE or DVT, no recent travel immobilization, no leg pain or swelling, no hemoptysis, no exogenous hormones.  Patient does not take any medications at home.  Does not smoke.  Denies any drug use.  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 Authorizing Provider  cyclobenzaprine (FLEXERIL) 10 MG tablet Take 1 tablet (10 mg total) 3 (three) times daily as needed by mouth for muscle spasms (with headache). 03/09/17   Shaune Pollack, MD  ibuprofen (ADVIL,MOTRIN) 600 MG tablet Take 600 mg every 6 (six) hours as needed by mouth.    [provider]  RANITIDINE HCL PO Take by mouth.    [provider]    Allergies Patient has no known allergies.  Family History  Problem Relation Age of Onset     Diabetes Father     Social History Social History   Tobacco Use   Smoking status: Never Smoker   Smokeless tobacco: Never Used  Substance Use Topics   Alcohol use: No   Drug use: No    Review of Systems  Constitutional: Negative for fever. Eyes: Negative for visual changes. ENT: Negative for sore throat. Neck: No neck pain  Cardiovascular: + chest pain. Respiratory: + shortness of breath. Gastrointestinal: Negative for abdominal pain, vomiting or diarrhea. Genitourinary: Negative for dysuria. Musculoskeletal: Negative for back pain. Skin: Negative for rash. Neurological: Negative for headaches, weakness or numbness. Psych: No SI or HI  ____________________________________________   PHYSICAL EXAM:  VITAL SIGNS: ED Triage Vitals  Enc Vitals Group     BP 12/18/18 1934 (!) 188/115     Pulse Rate 12/18/18 1934 (!) 101     Resp 12/18/18 1934 (!) 22     Temp 12/18/18 1934 99.4 F (37.4 C)     Temp Source 12/18/18 1934 Oral     SpO2 12/18/18 1934 100 %     Weight 12/18/18 1932 (!) 370 lb (167.8 kg)     Height 12/18/18 1932 6' (1.829 m)     Head Circumference --      Peak Flow --      Pain Score 12/18/18 1932 10     Pain Loc --      Pain Edu? --  Excl. in [place]? --     Constitutional: Alert and oriented, morbidly obese, in no acute distress.  HEENT:      Head: Normocephalic and atraumatic.         Eyes: Conjunctivae are normal. Sclera is non-icteric.       Mouth/Throat: Mucous membranes are moist.       Neck: Supple with no signs of meningismus. Cardiovascular: Tachycardic with regular rate and rhythm. No murmurs, gallops, or rubs. 2+ symmetrical distal pulses are present in all extremities. No JVD. Respiratory: Normal respiratory effort. Lungs are clear to auscultation bilaterally. No wheezes, crackles, or rhonchi.  Gastrointestinal: Soft, non tender, and non distended with positive bowel sounds. No rebound or guarding. Musculoskeletal: Nontender with  normal range of motion in all extremities. No edema, cyanosis, or erythema of extremities. Neurologic: Normal speech and language. Face is symmetric. Moving all extremities. No gross focal neurologic deficits are appreciated. Skin: Skin is warm, dry and intact. No rash noted. Psychiatric: Mood and affect are normal. Speech and behavior are normal.  ____________________________________________   LABS (all labs ordered are listed, but only abnormal results are displayed)  Labs Reviewed  BASIC METABOLIC PANEL - Abnormal; Notable for the following components:      Result Value   Glucose, Bld 177 (*)    All other components within normal limits  CBC - Abnormal; Notable for the following components:   Hemoglobin 12.8 (*)    HCT 38.6 (*)    Platelets 412 (*)    All other components within normal limits  TROPONIN I (HIGH SENSITIVITY)  TROPONIN I (HIGH SENSITIVITY)   ____________________________________________  EKG  ED ECG REPORT I, Rudene Re, the attending physician, personally viewed and interpreted this ECG.  Sinus tachycardia, rate of 100, normal intervals, normal axis, T wave inversions in inferior and lateral leads with no ST elevations.  No prior for comparison.  02:56 am -normal sinus rhythm, rate of 80, normal intervals, normal axis, persistent T wave inversions in inferior lateral leads with no ST elevations or depressions.  Unchanged from initial. ____________________________________________  RADIOLOGY  I have personally reviewed the images performed during this visit and I agree with the Radiologist's read.   Interpretation by Radiologist:  Dg Chest 2 View  Result Date: 12/18/2018 CLINICAL DATA:  Chest pain radiating to the back since yesterday. EXAM: CHEST - 2 VIEW COMPARISON:  Chest x-ray dated December 02, 2007. FINDINGS: The heart size and mediastinal contours are within normal limits. Both lungs are clear. The visualized skeletal structures are unremarkable.  IMPRESSION: No active cardiopulmonary disease. Electronically Signed   By: Titus Dubin M.D.   On: 12/18/2018 20:09   Ct Angio Chest Aorta W And/or Wo Contrast  Result Date: 12/19/2018 CLINICAL DATA:  Chest and back pain EXAM: CT ANGIOGRAPHY CHEST WITH CONTRAST TECHNIQUE: Multidetector CT imaging of the chest was performed using the standard protocol during bolus administration of intravenous contrast. Multiplanar CT image reconstructions and MIPs were obtained to evaluate the vascular anatomy. CONTRAST:  161mL OMNIPAQUE IOHEXOL 350 MG/ML SOLN COMPARISON:  None. FINDINGS: Cardiovascular: --Aorta: Satisfactory opacification of the thoracic aorta. No aortic dissection or other acute aortic syndrome. Conventional 3 vessel aortic branching pattern. There is no aortic atherosclerosis. --Heart: Normal size. No pericardial effusion. --Pulmonary arteries: Contrast bolus timing is optimized for the aorta. Within that limitation, there is no central pulmonary embolus. Mediastinum/Nodes: No mediastinal, hilar or axillary lymphadenopathy. The visualized thyroid and thoracic esophageal course are unremarkable. Lungs/Pleura: No pulmonary nodules or masses.  No pleural effusion or pneumothorax. No focal airspace consolidation. No focal pleural abnormality. Upper Abdomen: Contrast bolus timing is not optimized for evaluation of the abdominal organs. The visualized portions of the organs of the upper abdomen are normal. Musculoskeletal: No chest wall abnormality. No bony spinal canal stenosis. Review of the MIP images confirms the above findings. IMPRESSION: No acute aortic syndrome or other acute thoracic abnormality. No central pulmonary embolus within the limitation of contrast optimization for the aorta. Electronically Signed   By: Deatra RobinsonKevin  Herman M.D.   On: 12/19/2018 04:19     ____________________________________________   PROCEDURES  Procedure(s) performed: None Procedures Critical Care performed:   None ____________________________________________   INITIAL IMPRESSION / ASSESSMENT AND PLAN / ED COURSE   28 y.o. male with a history of morbidly obesity, eczema and pityriasis rubra who presents for evaluation of pleuritic CP and SOB x 24 hrs. patient is tachycardic, slightly increase respiratory rate with normal work of breathing, normal sats, low-grade temp of 99.4.  He is extremely hypertensive, no prior h/o such. Has not seen a doctor since 03/2017. Normal strong euqla pulses. EKG showing TWI in inferior lateral leads with no ST elevation and no prior for comparison.  First troponin is negative.  Chest x-ray with no evidence of pneumothorax or widened mediastinum, no evidence of pulmonary edema or pneumonia.  Labs showing elevated glucose of 177 but no other acute findings.  Differential diagnosis including PE versus dissection versus ACS versus pneumonia.  We will send patient for CT Angio of the chest.  Will repeat troponin.    _________________________ 4:45 AM on 12/19/2018 -----------------------------------------  Second troponin negative.  CT angio with no evidence of dissection or PE, no evidence of pneumonia either.  Patient's pain resolved without intervention. Repeat EKG after pain resolved showing no changes from initial EKG. BG elevated. BP came down once pain resolved. Discussed close follow-up with PCP for further evaluation of chest pain and elevated blood glucose.  Discussed my standard return precautions for new or worsening chest pain, shortness of breath, or fever.    As part of my medical decision making, I reviewed the following data within the electronic MEDICAL RECORD NUMBER Nursing notes reviewed and incorporated, Labs reviewed , EKG interpreted , Old EKG reviewed, Old chart reviewed, Radiograph reviewed  Notes from prior ED visits and [place] Controlled Substance Database   Patient was evaluated in Emergency Department today for the symptoms described in the history of present  illness. Patient was evaluated in the context of the global COVID-19 pandemic, which necessitated consideration that the patient might be at risk for infection with the SARS-CoV-2 virus that causes COVID-19. Institutional protocols and algorithms that pertain to the evaluation of patients at risk for COVID-19 are in a state of rapid change based on information released by regulatory bodies including the CDC and federal and state organizations. These policies and algorithms were followed during the patient's care in the ED.   ____________________________________________   FINAL CLINICAL IMPRESSION(S) / ED DIAGNOSES   Final diagnoses:  Chest pain, unspecified type  Elevated random blood glucose level      NEW MEDICATIONS STARTED DURING THIS VISIT:  ED Discharge Orders    None       Note:  This document was prepared using Dragon voice recognition software and may include unintentional dictation errors.    Don PerkingVeronese, [place], MD 12/19/18 [phone]

## 2018-12-19 NOTE — ED Notes (Signed)
Patient transported to CT 

## 2018-12-19 NOTE — Discharge Instructions (Signed)

## 2018-12-19 NOTE — ED Notes (Signed)
Pt c/o of right sternal CP that started in the back on Wednesday, works as Scientist, physiological, denies tobacco use, no chronic health problems or heart conditions, takes no meds, pt reports when inhaling or moving CP is 7/10 otherwise nil, denies cough or SOB with exertion  Pt reports last neg covid test on Tuesday (works at a SN[employer])

## 2020-01-25 IMAGING — CT CT ANGIOGRAPHY CHEST
3 of 7 series · 18 of 46 positions shown · IV contrast (APPLIED)
Comparison: None.

CLINICAL DATA: Chest and back pain

EXAM:
CT ANGIOGRAPHY CHEST WITH CONTRAST
TECHNIQUE: Multidetector CT imaging of the chest was performed using the
standard protocol during bolus administration of intravenous
contrast. Multiplanar CT image reconstructions and MIPs were
obtained to evaluate the vascular anatomy.
CONTRAST:  125mL OMNIPAQUE IOHEXOL 350 MG/ML SOLN

[Series 7: axial arterial · axial · arterial · 0.76mm/px · z∈[-674,-431]mm · 8 of 105 slices shown]
[im 12/105  lung]
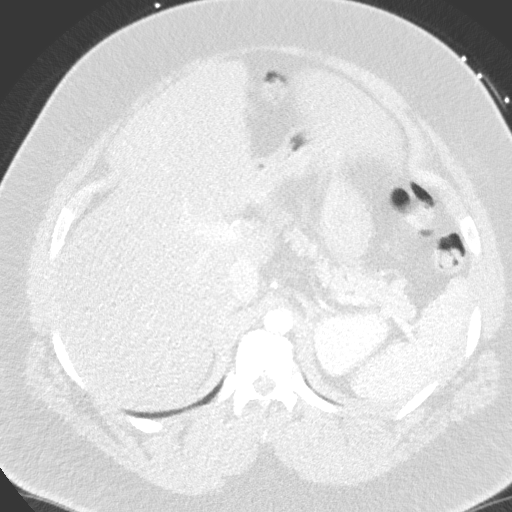
[im 24/105  soft-tissue]
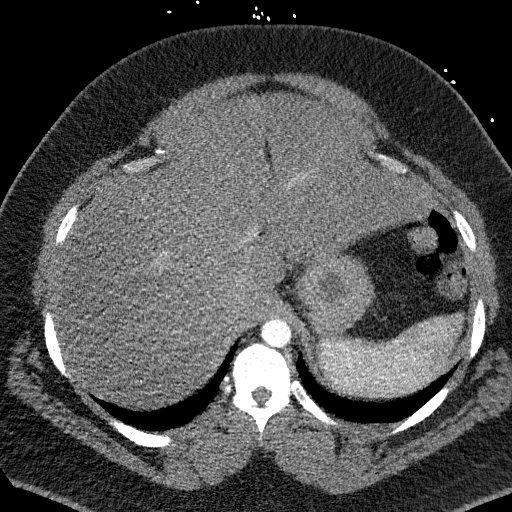
[im 35/105  lung]
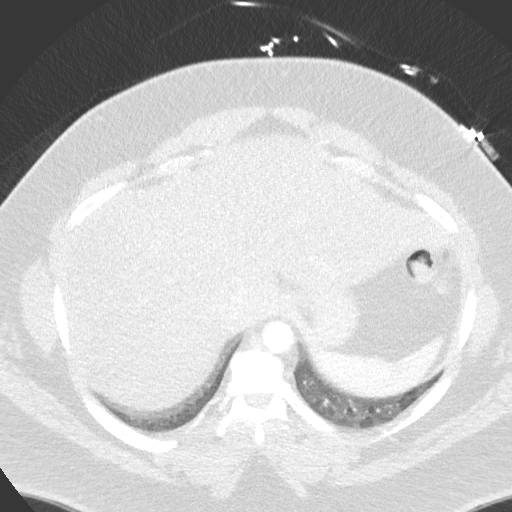
[im 47/105  soft-tissue]
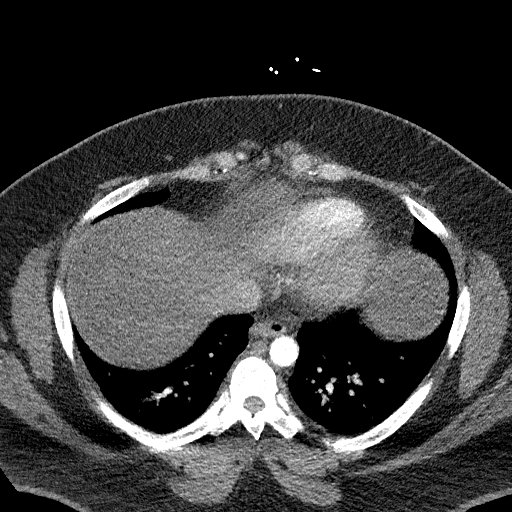
[im 58/105  lung]
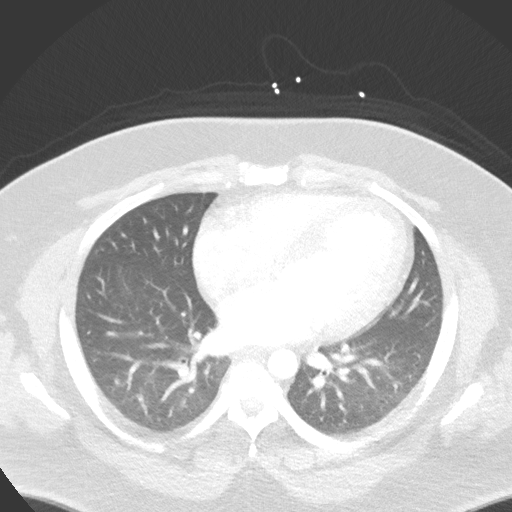
[im 70/105  soft-tissue]
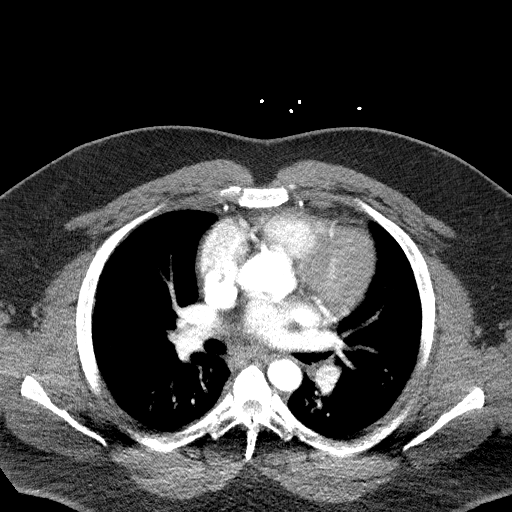
[im 81/105  lung]
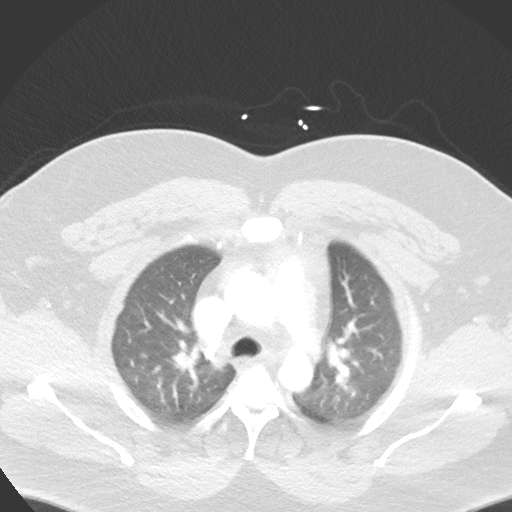
[im 93/105  soft-tissue]
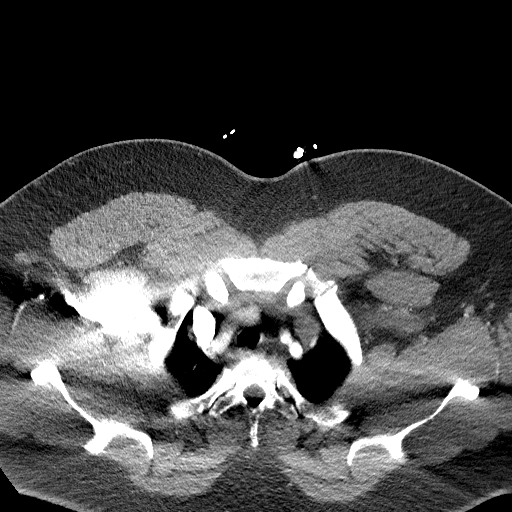

[Series 8: lung · axial · 0.76mm/px · z∈[-687,-459]mm · 7 of 157 slices shown]
[im 11/157  soft-tissue]
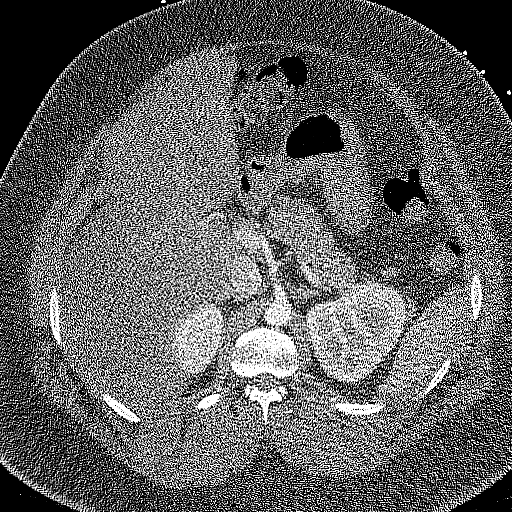
[im 32/157  soft-tissue]
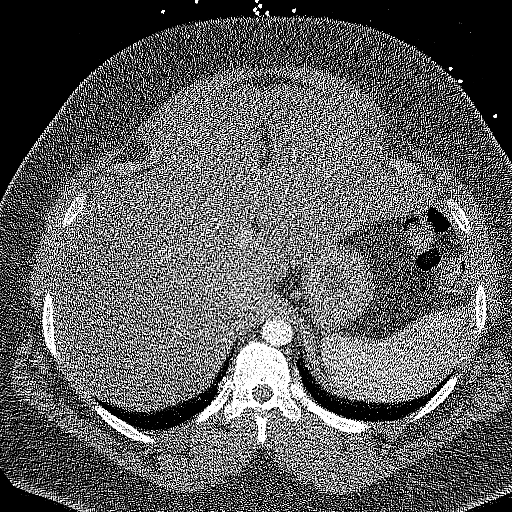
[im 53/157  soft-tissue]
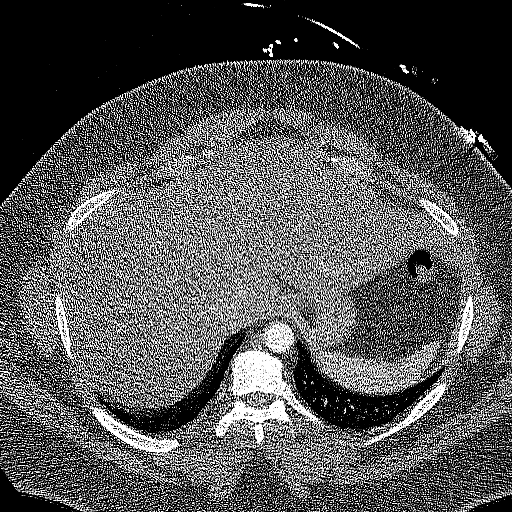
[im 73/157  soft-tissue]
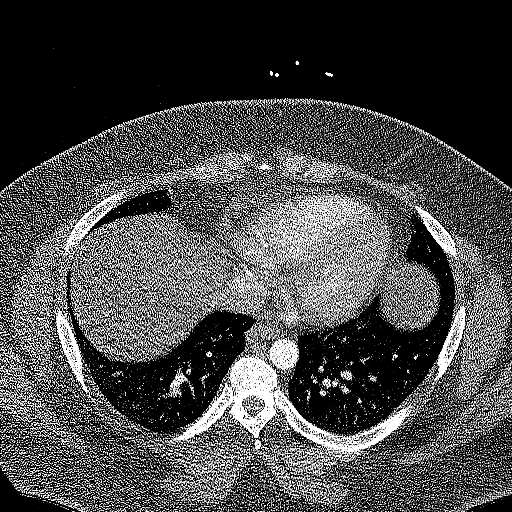
[im 84/157  soft-tissue]
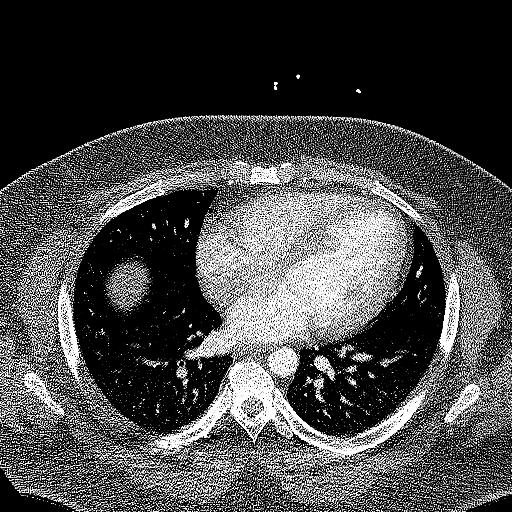
[im 105/157  soft-tissue]
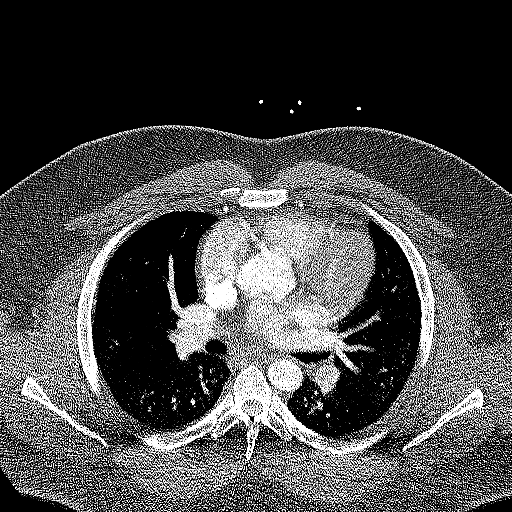
[im 125/157  soft-tissue]
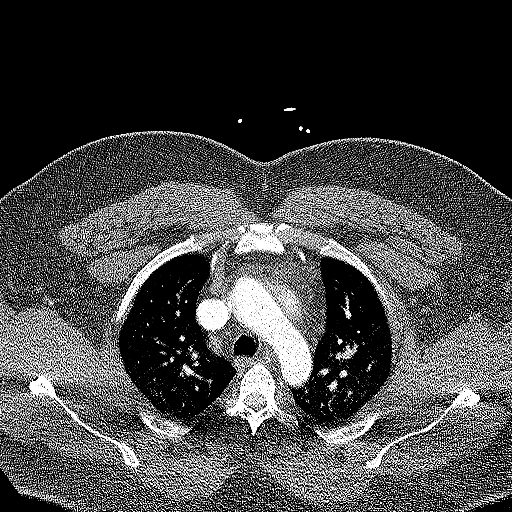

[Series 9: coronals · coronal · 0.75mm/px · 3 of 145 slices shown]
[im 37/145  soft-tissue]
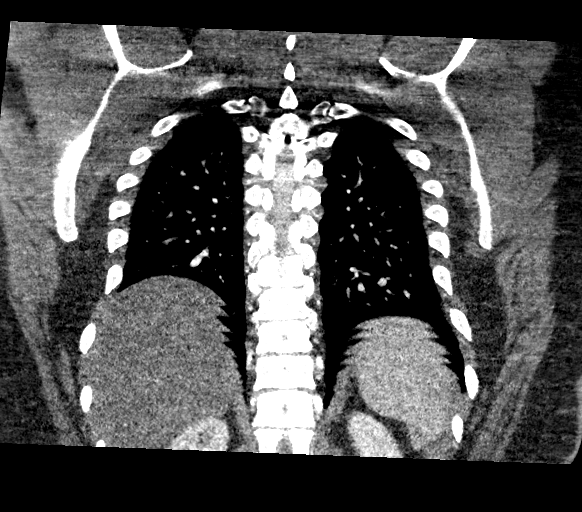
[im 73/145  soft-tissue]
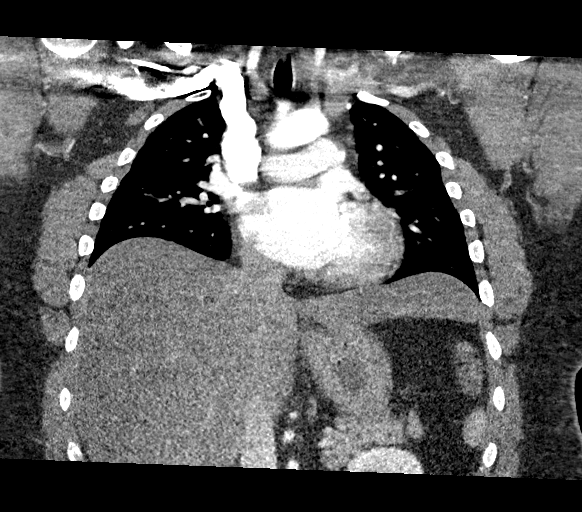
[im 109/145  soft-tissue]
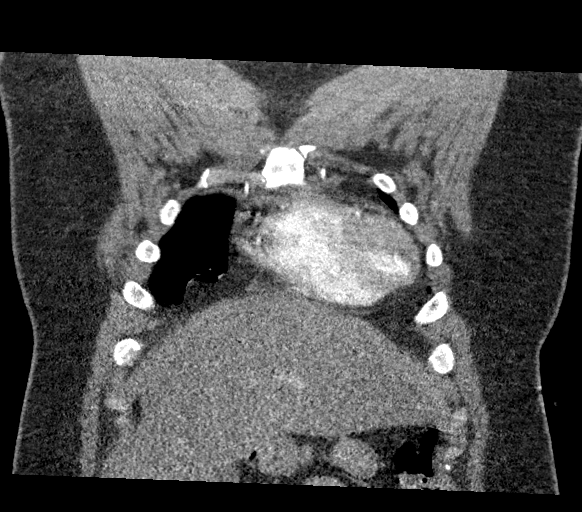

[18 of 46 positions shown; findings below may reference images not displayed]

FINDINGS: Cardiovascular:

--Aorta: Satisfactory opacification of the thoracic aorta. No aortic
dissection or other acute aortic syndrome. Conventional 3 vessel
aortic branching pattern. There is no aortic atherosclerosis.

--Heart: Normal size. No pericardial effusion.

--Pulmonary arteries: Contrast bolus timing is optimized for the
aorta. Within that limitation, there is no central pulmonary
embolus.

Mediastinum/Nodes: No mediastinal, hilar or axillary
lymphadenopathy. The visualized thyroid and thoracic esophageal
course are unremarkable.

Lungs/Pleura: No pulmonary nodules or masses. No pleural effusion or
pneumothorax. No focal airspace consolidation. No focal pleural
abnormality.

Upper Abdomen: Contrast bolus timing is not optimized for evaluation
of the abdominal organs. The visualized portions of the organs of
the upper abdomen are normal.

Musculoskeletal: No chest wall abnormality. No bony spinal canal
stenosis.

Review of the MIP images confirms the above findings.
IMPRESSION: No acute aortic syndrome or other acute thoracic abnormality.

No central pulmonary embolus within the limitation of contrast
optimization for the aorta.

## 2020-11-13 ENCOUNTER — Encounter (HOSPITAL_BASED_OUTPATIENT_CLINIC_OR_DEPARTMENT_OTHER): Payer: Self-pay | Admitting: Emergency Medicine

## 2020-11-13 ENCOUNTER — Other Ambulatory Visit: Payer: Self-pay

## 2020-11-13 ENCOUNTER — Emergency Department (HOSPITAL_BASED_OUTPATIENT_CLINIC_OR_DEPARTMENT_OTHER)
Admission: EM | Admit: 2020-11-13 | Discharge: 2020-11-13 | Disposition: A | Discharge status: Home / Self Care | Payer: Self-pay | Attending: Emergency Medicine | Admitting: Emergency Medicine

## 2020-11-13 DIAGNOSIS — M542 Cervicalgia: Secondary | ICD-10-CM | POA: Insufficient documentation

## 2020-11-13 DIAGNOSIS — Z5321 Procedure and treatment not carried out due to patient leaving prior to being seen by health care provider: Secondary | ICD-10-CM | POA: Insufficient documentation

## 2020-11-13 DIAGNOSIS — R202 Paresthesia of skin: Secondary | ICD-10-CM | POA: Insufficient documentation

## 2020-11-13 HISTORY — DX: Essential (primary) hypertension: I10

## 2020-11-13 NOTE — ED Triage Notes (Signed)
Pt reports neck pain "on and off for a couple months." Pinpoints pain to L trapezius area between neck and shoulder. States difficulty turning head to the right. Reports numbness to  arm, 1st, 2nd and 3rd digits.

## 2022-10-05 LAB — AMB RESULTS CONSOLE CBG: Glucose: 206

## 2022-10-08 ENCOUNTER — Encounter (HOSPITAL_BASED_OUTPATIENT_CLINIC_OR_DEPARTMENT_OTHER): Payer: Self-pay | Admitting: Emergency Medicine

## 2022-11-04 NOTE — Progress Notes (Signed)
Pt attended a screening event on 10/05/22, where screening results BP 142/99 and Glucose 206. At the event, Pt shared he has PCP in [place] and no SDOH insecurities was indicated. Pt was provided with community care clinic flyer at the event. Per chart review, Gwenyth Bouillon, FNP is listed as PCP on one of the visit the Pt had on 06/05/22 at [facility]. Pt has upcomming appointment on 05/29/2023. Pt was contacted for BP, Glucose, and PCP follow up. During a call, Pt shared he is looking to be established with PCP in [place] since he moved from [place]. Pt agreed community care clinic and get care now flyer to be mailed to him. In mean time, Pt was provided with contact number of [facility]. A letter was mailed to Pt with PCP resources.

## 2022-11-20 ENCOUNTER — Ambulatory Visit: Payer: No Typology Code available for payment source | Admitting: Physician Assistant

## 2022-11-27 ENCOUNTER — Encounter: Payer: Self-pay | Admitting: Family Medicine

## 2022-11-27 ENCOUNTER — Other Ambulatory Visit: Payer: Self-pay | Admitting: Family Medicine

## 2022-11-27 ENCOUNTER — Ambulatory Visit: Payer: No Typology Code available for payment source | Admitting: Family Medicine

## 2022-11-27 VITALS — BP 134/86 | HR 80 | Temp 97.7°F | Ht 71.75 in | Wt 374.6 lb

## 2022-11-27 DIAGNOSIS — Z7984 Long term (current) use of oral hypoglycemic drugs: Secondary | ICD-10-CM

## 2022-11-27 DIAGNOSIS — Z6841 Body Mass Index (BMI) 40.0 and over, adult: Secondary | ICD-10-CM

## 2022-11-27 DIAGNOSIS — E230 Hypopituitarism: Secondary | ICD-10-CM | POA: Insufficient documentation

## 2022-11-27 DIAGNOSIS — E0865 Diabetes mellitus due to underlying condition with hyperglycemia: Secondary | ICD-10-CM

## 2022-11-27 DIAGNOSIS — G4733 Obstructive sleep apnea (adult) (pediatric): Secondary | ICD-10-CM | POA: Diagnosis not present

## 2022-11-27 DIAGNOSIS — D352 Benign neoplasm of pituitary gland: Secondary | ICD-10-CM | POA: Diagnosis not present

## 2022-11-27 DIAGNOSIS — E1159 Type 2 diabetes mellitus with other circulatory complications: Secondary | ICD-10-CM | POA: Diagnosis not present

## 2022-11-27 DIAGNOSIS — I152 Hypertension secondary to endocrine disorders: Secondary | ICD-10-CM | POA: Insufficient documentation

## 2022-11-27 DIAGNOSIS — E291 Testicular hypofunction: Secondary | ICD-10-CM

## 2022-11-27 DIAGNOSIS — Z7985 Long-term (current) use of injectable non-insulin antidiabetic drugs: Secondary | ICD-10-CM

## 2022-11-27 LAB — POCT GLYCOSYLATED HEMOGLOBIN (HGB A1C): Hemoglobin A1C: 11.2 % — AB (ref 4.0–5.6)

## 2022-11-27 MED ORDER — OZEMPIC (0.25 OR 0.5 MG/DOSE) 2 MG/3ML ~~LOC~~ SOPN: PEN_INJECTOR | SUBCUTANEOUS | 0 refills | Status: DC

## 2022-11-27 MED ORDER — BLOOD GLUCOSE TEST VI STRP
1.0000 | ORAL_STRIP | Freq: Three times a day (TID) | 0 refills | Status: DC
Start: 2022-11-27 — End: 2023-01-06

## 2022-11-27 MED ORDER — METFORMIN HCL ER 500 MG PO TB24: 1000.0000 mg | ORAL_TABLET | Freq: Two times a day (BID) | ORAL | 0 refills | Status: AC

## 2022-11-27 MED ORDER — LANCETS MISC. MISC
1.0000 | Freq: Three times a day (TID) | 0 refills | Status: AC
Start: 2022-11-27 — End: 2022-12-27

## 2022-11-27 MED ORDER — LANCET DEVICE MISC
1.0000 | Freq: Three times a day (TID) | 0 refills | Status: AC
Start: 2022-11-27 — End: 2022-12-27

## 2022-11-27 MED ORDER — BLOOD GLUCOSE MONITORING SUPPL DEVI
1.0000 | Freq: Three times a day (TID) | 0 refills | Status: AC
Start: 2022-11-27 — End: ?

## 2022-11-27 NOTE — Patient Instructions (Signed)
Take metformin as prescribed, 1000 mg twice daily (morning and evening). Restart Ozempic at 0.25 mg for the first 2 weeks, then increase to 0.5 mg for the next 2 weeks, and continue at 0.5 mg until endocrinology evaluation. Monitor your blood sugar daily using the provided glucometer, and as needed if you feel unwell. Follow up in 30 days for a recheck of your blood sugar levels and to discuss progress. Attend referrals to endocrinology for hormone evaluation, neurosurgery for pituitary tumor management, and a sleep study for sleep apnea.

## 2022-11-27 NOTE — Assessment & Plan Note (Signed)
Referral for a repeat sleep study to assess current CPAP needs.

## 2022-11-27 NOTE — Assessment & Plan Note (Signed)
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 Restart Ozempic, starting at 0.25mg /week, escalating dose gradually. Provide glucometer and test strips; instruct patient to monitor blood glucose daily. Recheck blood sugar levels in 30 days.

## 2022-11-27 NOTE — Assessment & Plan Note (Signed)
Encourage consistent use of lisinopril 10mg  daily. Monitor blood pressure regularly.

## 2022-11-27 NOTE — Progress Notes (Signed)
Assessment/Plan:   Problem List Items Addressed This Visit       Cardiovascular and Mediastinum   Hypertension associated with diabetes (HCC)    Encourage consistent use of lisinopril 10mg  daily. Monitor blood pressure regularly.      Relevant Medications   lisinopril (ZESTRIL) 10 MG tablet   Semaglutide,0.25 or 0.5MG /DOS, (OZEMPIC, 0.25 OR 0.5 MG/DOSE,) 2 MG/3ML SOPN   metFORMIN (GLUCOPHAGE-XR) 500 MG 24 hr tablet     Respiratory   OSA (obstructive sleep apnea)    Referral for a repeat sleep study to assess current CPAP needs.      Relevant Orders   Ambulatory referral to Sleep Studies     Endocrine   Pituitary adenoma [facility])    Plan: Referral to endocrinology for further hormone management. Referral to neurosurgery for tumor assessment. Initiate hormonal panel including prolactin, TSH, ACTH, cortisol, FSH, LH, testosterone, and IGF1.      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 Restart Ozempic, starting at 0.25mg /week, escalating dose gradually. Provide glucometer and test strips; instruct patient to monitor blood glucose daily. Recheck blood sugar levels in 30 days.      Relevant Medications   lisinopril (ZESTRIL) 10 MG tablet   Semaglutide,0.25 or 0.5MG /DOS, (OZEMPIC, 0.25 OR 0.5 MG/DOSE,) 2 MG/3ML SOPN   metFORMIN (GLUCOPHAGE-XR) 500 MG 24 hr tablet   Blood Glucose Monitoring Suppl DEVI   Glucose Blood (BLOOD GLUCOSE TEST STRIPS) STRP   Lancet Device MISC   Lancets Misc. 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)   Relevant Orders   PTH, intact (no Ca)   ACTH   Insulin-like growth factor   FSH/LH    Testosterone,Free and Total   Thyroid Panel With TSH   Salivary Cortisol   Lipid panel   Microalbumin / creatinine urine ratio   Urinalysis, Routine w reflex microscopic   Vitamin D 1,25 dihydroxy   CBC with Differential/Platelet   Comprehensive metabolic panel   Hypogonadism in male - Primary   Relevant Orders   Ambulatory referral to Endocrinology     Other   Class 3 severe obesity with serious comorbidity and body mass index (BMI) of 50.0 to 59.9 in adult [facility])   Relevant Medications   Semaglutide,0.25 or 0.5MG /DOS, (OZEMPIC, 0.25 OR 0.5 MG/DOSE,) 2 MG/3ML SOPN   metFORMIN (GLUCOPHAGE-XR) 500 MG 24 hr tablet   Other Relevant Orders   PTH, intact (no Ca)   ACTH   Insulin-like growth factor   FSH/LH   Testosterone,Free and Total   Thyroid Panel With TSH   Salivary Cortisol   Lipid panel   Microalbumin / creatinine urine ratio   Urinalysis, Routine w reflex microscopic   Vitamin D 1,25 dihydroxy   CBC with Differential/Platelet   Comprehensive metabolic panel    Medications Discontinued During This Encounter  Medication Reason   cyclobenzaprine (FLEXERIL) 10 MG tablet    ibuprofen (ADVIL,MOTRIN) 600 MG tablet    RANITIDINE HCL PO    metFORMIN (GLUCOPHAGE-XR) 500 MG 24 hr tablet Reorder    Return in about 4 weeks (around 12/25/2022) for DM, fasting labs in AM within 1 week of this appointment.    Subjective:   Encounter date: 11/27/2022  Gordon Taylor is a 32 y.o. male who has Devergie's disease; Pityriasis rubra pilaris; Primary localized osteoarthrosis, lower leg; Pituitary adenoma (HCC); Diabetes mellitus due to underlying condition with hyperglycemia, without long-term current use of insulin (HCC); Class 3 severe obesity with serious comorbidity and body mass index (BMI) of 50.0 to 59.9 in adult [facility]); Panhypopituitarism (HCC); OSA (obstructive sleep apnea); Hypogonadism in male; and Hypertension associated with diabetes (HCC) on their problem list..   He  has a  past medical history of Eczema, Hypertension, and Reflux esophagitis..   Chief Complaint: The patient presents to establish care, with primary concerns being management of diabetes and pituitary tumor follow-up, including low testosterone and potential need for CPAP assessment.  History of Present Illness: Diabetes Mellitus. Patient has a history of Type 2 Diabetes with a recent A1C of 11.2. Currently on metformin 500mg  twice daily. The patient inquired about Ozempic and other treatment options to better control diabetes. They previously tried Ozempic but stopped due to experiencing unusual sensations in their legs.   Pituitary Tumor. The patient was diagnosed with a pituitary tumor affecting hormone levels, specifically very low testosterone impacting fertility goals. The patient reported infrequent headaches but no current visual disturbances. History of treatment with cabergoline noted.   Low Testosterone. Associated with the pituitary tumor, contributing to erectile dysfunction and other symptoms.   Obstructive Sleep Apnea. Patient requires updated management for sleep apnea, likely tied to the pituitary condition. Referral to sleep study planned, and potential requirement for CPAP reassessment discussed.  Hypertension. Patient has a prescription of lisinopril 10mg  which they have not been consistently taking.  Review of Systems  Constitutional:  Negative for chills, diaphoresis, fever, malaise/fatigue and weight loss.  HENT:  Negative for congestion, ear discharge, ear pain and hearing loss.   Eyes:  Negative for blurred vision, double vision, photophobia, pain, discharge and redness.  Respiratory:  Negative for cough, sputum production, shortness of breath and wheezing.   Cardiovascular:  Negative for chest pain and palpitations.  Gastrointestinal:  Negative for abdominal pain, blood in stool, constipation, diarrhea, heartburn, melena, nausea and vomiting.  Genitourinary:  Negative for  dysuria, flank pain, frequency, hematuria and urgency.  Musculoskeletal:  Negative for myalgias.  Skin:  Negative for itching and rash.  Neurological:  Positive for headaches. Negative for dizziness, tingling, tremors, speech change, seizures, loss of consciousness and weakness.  Endo/Heme/Allergies:  Negative for polydipsia.  Psychiatric/Behavioral:  Negative for depression, hallucinations, memory loss, substance abuse and suicidal ideas. The patient does not have insomnia.   All other systems reviewed and are negative.   Past Surgical History:  Procedure Laterality Date   HAND SURGERY     KNEE ARTHROSCOPY      Outpatient Medications Prior to Visit  Medication Sig Dispense Refill   Cholecalciferol 125 MCG (5000 UT) TABS Take by mouth.     lisinopril (ZESTRIL) 10 MG tablet Take 10 mg by mouth daily.     metFORMIN (GLUCOPHAGE-XR) 500 MG 24 hr tablet Take 500 mg by mouth 2 (two) times daily.     cyclobenzaprine (FLEXERIL) 10 MG tablet Take 1 tablet (10 mg total) 3 (three) times daily as needed by mouth for muscle spasms (with headache). (Patient not taking: Reported on 11/27/2022) 30 tablet 0   ibuprofen (ADVIL,MOTRIN) 600 MG tablet Take 600 mg every 6 (six) hours as needed by mouth. (Patient not taking: Reported on 11/27/2022)     RANITIDINE HCL PO Take by mouth. (Patient not taking: Reported on 11/27/2022)  No facility-administered medications prior to visit.    Family History  Problem Relation Age of Onset   Diabetes Father     Social History   Socioeconomic History   Marital status: Married    Spouse name: Not on file   Number of children: Not on file   Years of education: Not on file   Highest education level: Not on file  Occupational History   Not on file  Tobacco Use   Smoking status: Never    Passive exposure: Never   Smokeless tobacco: Never  Vaping Use   Vaping status: Never Used  Substance and Sexual Activity   Alcohol use: Yes    Comment: socially   Drug  use: No   Sexual activity: Never    Birth control/protection: Condom  Other Topics Concern   Not on file  Social History Narrative   ** Merged History Encounter **       Social Determinants of Health   Financial Resource Strain: Not on file  Food Insecurity: No Food Insecurity (10/05/2022)   Hunger Vital Sign    Worried About Running Out of Food in the Last Year: Never true    Ran Out of Food in the Last Year: Never true  Transportation Needs: No Transportation Needs (10/05/2022)   PRAPARE - Administrator, Civil Service (Medical): No    Lack of Transportation (Non-Medical): No  Physical Activity: Not on file  Stress: Not on file  Social Connections: Unknown (08/25/2021)   [facility] from [facility]   Social Network    Social Network: Not on file  Intimate Partner Violence: Not At Risk (10/05/2022)   Humiliation, Afraid, Rape, and Kick questionnaire    Fear of Current or Ex-Partner: No    Emotionally Abused: No    Physically Abused: No    Sexually Abused: No                                                                                                  Objective:  Physical Exam: BP 134/86 (BP Location: Left Arm, Patient Position: Sitting, Cuff Size: Large)   Pulse 80   Temp 97.7 F (36.5 C) (Temporal)   Ht 5' 11.75" (1.822 m)   Wt (!) 374 lb 9.6 oz (169.9 kg)   SpO2 99%   BMI 51.16 kg/m     Physical Exam  No results found.  Recent Results (from the past 2160 hour(s))  CBG     Status: None   Collection Time: 10/05/22 11:04 AM  Result Value Ref Range   Glucose 206   POCT glycosylated hemoglobin (Hb A1C)     Status: Abnormal   Collection Time: 11/27/22  1:17 PM  Result Value Ref Range   Hemoglobin A1C 11.2 (A) 4.0 - 5.6 %   HbA1c POC (<> result, manual entry)     HbA1c, POC (prediabetic range)     HbA1c, POC (controlled diabetic range)          Garner Nash, MD, MS

## 2022-11-27 NOTE — Assessment & Plan Note (Signed)
Plan: Referral to endocrinology for further hormone management. Referral to neurosurgery for tumor assessment. Initiate hormonal panel including prolactin, TSH, ACTH, cortisol, FSH, LH, testosterone, and IGF1.

## 2022-11-28 ENCOUNTER — Telehealth: Payer: Self-pay | Admitting: Family Medicine

## 2022-11-28 MED ORDER — OZEMPIC (0.25 OR 0.5 MG/DOSE) 2 MG/3ML ~~LOC~~ SOPN
PEN_INJECTOR | SUBCUTANEOUS | 0 refills | Status: AC
Start: 2022-11-28 — End: 2022-12-28

## 2022-11-28 NOTE — Telephone Encounter (Signed)
Spoke to [employer] and advised them that [facility] actually [facility] [address]. [facility] rx and [facility]pic was rerouted to [facility] on [address].

## 2022-11-28 NOTE — Addendum Note (Signed)
Addended by: Trudee Kuster on: 11/28/2022 12:53 PM   Modules accepted: Orders

## 2022-11-28 NOTE — Telephone Encounter (Signed)
[facility] mail order phone # [phone] and ref # is [id number]. Need you to call them about the direction on medication

## 2022-12-05 ENCOUNTER — Encounter (INDEPENDENT_AMBULATORY_CARE_PROVIDER_SITE_OTHER): Payer: Self-pay

## 2022-12-24 ENCOUNTER — Other Ambulatory Visit: Payer: No Typology Code available for payment source

## 2022-12-30 ENCOUNTER — Telehealth: Payer: Self-pay | Admitting: Family Medicine

## 2022-12-30 ENCOUNTER — Ambulatory Visit: Payer: No Typology Code available for payment source | Admitting: Family Medicine

## 2022-12-30 NOTE — Telephone Encounter (Signed)
same day cancel, cannot make copay & doesn't need visit per pt   Sent no show letter via [employer]

## 2023-01-06 ENCOUNTER — Encounter: Payer: Self-pay | Admitting: *Deleted

## 2023-01-06 ENCOUNTER — Institutional Professional Consult (permissible substitution): Payer: No Typology Code available for payment source | Admitting: Neurology

## 2023-01-06 ENCOUNTER — Other Ambulatory Visit: Payer: Self-pay | Admitting: Family Medicine

## 2023-01-06 ENCOUNTER — Encounter: Payer: Self-pay | Admitting: Neurology

## 2023-01-06 DIAGNOSIS — E0865 Diabetes mellitus due to underlying condition with hyperglycemia: Secondary | ICD-10-CM

## 2023-01-06 NOTE — Progress Notes (Signed)
Pt attended 10/05/22 screening event where his b/p was 142/99 and his blood sugar was 206. At the event, the pt did not name a PCP and did not identify any SDOH insecurities. During the initial event f/u pt noted he had moved from [place] and wanted to get established with a local PCP. Health equity team member mailed PCP info to pt. During the 60 day event f/u, chart review indicates that pt established care with Dr. Fanny Bien at [facility] location on 11/27/22 (where his b/p was 138/86 and his A1C was 11.2)  and pt has received medication refills since that time, also consulting with endocrinologist Dr. Talmage Nap at [facility] on  12/10/22 (see media referral info). No additional health equity team support indicated at this time.

## 2023-03-01 ENCOUNTER — Other Ambulatory Visit: Payer: Self-pay | Admitting: Family Medicine

## 2023-03-01 DIAGNOSIS — E0865 Diabetes mellitus due to underlying condition with hyperglycemia: Secondary | ICD-10-CM

## 2023-04-06 ENCOUNTER — Other Ambulatory Visit: Payer: Self-pay | Admitting: Family Medicine

## 2023-04-06 DIAGNOSIS — E0865 Diabetes mellitus due to underlying condition with hyperglycemia: Secondary | ICD-10-CM

## 2024-03-10 ENCOUNTER — Ambulatory Visit
Admission: EM | Admit: 2024-03-10 | Discharge: 2024-03-10 | Disposition: A | Discharge status: Home / Self Care | Attending: Nurse Practitioner | Admitting: Nurse Practitioner

## 2024-03-10 ENCOUNTER — Other Ambulatory Visit: Payer: Self-pay

## 2024-03-10 DIAGNOSIS — R051 Acute cough: Secondary | ICD-10-CM

## 2024-03-10 DIAGNOSIS — G44201 Tension-type headache, unspecified, intractable: Secondary | ICD-10-CM

## 2024-03-10 DIAGNOSIS — J069 Acute upper respiratory infection, unspecified: Secondary | ICD-10-CM

## 2024-03-10 LAB — POC COVID19/FLU A&B COMBO
Covid Antigen, POC: NEGATIVE
Influenza A Antigen, POC: NEGATIVE
Influenza B Antigen, POC: NEGATIVE

## 2024-03-10 MED ORDER — BUTALBITAL-APAP-CAFFEINE 50-325-40 MG PO TABS: 1.0000 | ORAL_TABLET | Freq: Four times a day (QID) | ORAL | 0 refills | Status: AC | PRN

## 2024-03-10 MED ORDER — KETOROLAC TROMETHAMINE 60 MG/2ML IM SOLN
60.0000 mg | Freq: Once | INTRAMUSCULAR | Status: AC
  Administered 2024-03-10: 60 mg via INTRAMUSCULAR

## 2024-03-10 MED ORDER — FLUTICASONE PROPIONATE 50 MCG/ACT NA SUSP: 2.0000 | Freq: Every day | NASAL | 0 refills | Status: AC

## 2024-03-10 MED ORDER — PROMETHAZINE-DM 6.25-15 MG/5ML PO SYRP: 10.0000 mL | ORAL_SOLUTION | Freq: Four times a day (QID) | ORAL | 0 refills | Status: AC | PRN

## 2024-03-10 NOTE — ED Provider Notes (Signed)
 [facility]    CSN: [id number] Arrival date & time: 03/10/24  0944      History   Chief Complaint Chief Complaint  Patient presents with   Cough   Headache    HPI Gordon Taylor is a 33 y.o. male.   Discussed the use of AI scribe software for clinical note transcription with the patient, who gave verbal consent to proceed.   The patient, who has a history of diabetes and hypertension, presents with cough and headaches for the past several days that have not improved with Tylenol. 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 She reports subjective fevers, chills, and body aches. Nasal congestion was present initially but has mostly resolved. She continues to have a sore throat that worsens with coughing. Her cough is productive most of the time, with yellowish sputum noted. She denies runny nose, shortness of breath, wheezing, nausea, vomiting, or diarrhea. She does endorse dizziness and nausea associated with her headaches. Tylenol has been the only medication used for symptom relief. She denies smoking or vaping.  The following sections of the patient's history were reviewed and updated as appropriate: allergies, current medications, past family history, past medical history, past social history, past surgical history, and problem list.     Past Medical History:  Diagnosis Date   Eczema    Hypertension    Reflux esophagitis     Patient Active Problem List   Diagnosis Date Noted   Pituitary adenoma (HCC) 11/27/2022   Diabetes mellitus due to underlying condition with hyperglycemia, without long-term current use of insulin (HCC) 11/27/2022   Class 3 severe obesity with serious comorbidity and body mass index (BMI) of 50.0 to 59.9 in adult (HCC) 11/27/2022   Panhypopituitarism 11/27/2022   OSA (obstructive sleep apnea) 11/27/2022   Hypogonadism in male 11/27/2022   Hypertension  associated with diabetes (HCC) 11/27/2022   Devergie's disease 03/07/2014   Pityriasis rubra pilaris 03/07/2014   Primary localized osteoarthrosis, lower leg 09/06/2013    Past Surgical History:  Procedure Laterality Date   HAND SURGERY     KNEE ARTHROSCOPY         Home Medications    Prior to Admission medications   Medication Sig Start Date End Date Taking? Authorizing Provider  butalbital -acetaminophen-caffeine  (FIORICET) 50-325-40 MG tablet Take 1 tablet by mouth every 6 (six) hours as needed for headache. Do not to exceed 6 tablets per 24 hour period 03/10/24 03/10/25 Yes Reinhard Schack, FNP  fluticasone (FLONASE) 50 MCG/ACT nasal spray Place 2 sprays into both nostrils daily. Shake well before use. Gently blow nose before spraying. Do not blow nose immediately after use. You should not taste the medication or feel it going down your throat; if you do, adjust your technique. 03/10/24  Yes Iola Lukes, FNP  OZEMPIC , 2 MG/DOSE, 8 MG/3ML SOPN 2mg  Subcutaneous once a week 10/03/23  Yes [provider]  promethazine-dextromethorphan (PROMETHAZINE-DM) 6.25-15 MG/5ML syrup Take 10 mLs by mouth every 6 (six) hours as needed for cough. 03/10/24  Yes Iola Lukes, FNP  ACCU-CHEK GUIDE test strip 1 EACH BY IN VITRO ROUTE IN THE MORNING, AT NOON, AND AT BEDTIME. 01/06/23   Sebastian Beverley NOVAK, MD  Blood Glucose Monitoring Suppl DEVI 1 each by Does not apply route in the morning, at noon, and at bedtime. May substitute to any manufacturer covered by patient's insurance. 11/27/22   Sebastian Beverley NOVAK, MD  Cholecalciferol  125 MCG (5000 UT) TABS Take by mouth. 04/30/22   [provider]  lisinopril (ZESTRIL) 10 MG tablet Take 10 mg by mouth daily.    [provider]  metFORMIN  (GLUCOPHAGE -XR) 500 MG 24 hr tablet Take 2 tablets (1,000 mg total) by mouth 2 (two) times daily with a meal. 11/27/22 12/27/22  Sebastian Beverley NOVAK, MD    Family History Family History  Problem  Relation Age of Onset   Diabetes Father     Social History Social History   Tobacco Use   Smoking status: Never    Passive exposure: Never   Smokeless tobacco: Never  Vaping Use   Vaping status: Never Used  Substance Use Topics   Alcohol use: Yes    Comment: socially   Drug use: No     Allergies   Patient has no known allergies.   Review of Systems Review of Systems  Constitutional:  Negative for chills and fever.  HENT:  Positive for congestion (improving), sneezing and sore throat (due to the cough). Negative for rhinorrhea.   Respiratory:  Positive for cough (occasionally productive) and shortness of breath (a couple of days ago but none currently). Negative for wheezing.   Gastrointestinal:  Positive for nausea (a little). Negative for diarrhea and vomiting.  Musculoskeletal:  Positive for myalgias (a little).  Neurological:  Positive for headaches.  All other systems reviewed and are negative.    Physical Exam Triage Vital Signs ED Triage Vitals  Encounter Vitals Group     BP 03/10/24 1056 127/85     Girls Systolic BP Percentile --      Girls Diastolic BP Percentile --      Boys Systolic BP Percentile --      Boys Diastolic BP Percentile --      Pulse Rate 03/10/24 1056 85     Resp 03/10/24 1056 19     Temp 03/10/24 1056 98.4 F (36.9 C)     Temp Source 03/10/24 1056 Oral     SpO2 03/10/24 1056 98 %     Weight --      Height --      Head Circumference --      Peak Flow --      Pain Score 03/10/24 1057 10     Pain Loc --      Pain Education --      Exclude from Growth Chart --    No data found.  Updated Vital Signs BP 127/85 (BP Location: Right Arm)   Pulse 85   Temp 98.4 F (36.9 C) (Oral)   Resp 19   SpO2 98%   Visual Acuity Right Eye Distance:   Left Eye Distance:   Bilateral Distance:    Right Eye Near:   Left Eye Near:    Bilateral Near:     Physical Exam Vitals reviewed.  Constitutional:      General: He is awake. He is not  in acute distress.    Appearance: Normal appearance. He is well-developed. He is obese. He is not ill-appearing, toxic-appearing or diaphoretic.  HENT:     Head: Normocephalic.     Right Ear: Tympanic membrane, ear canal and external ear normal. No drainage, swelling or tenderness. No middle ear effusion. Tympanic membrane is not erythematous.     Left Ear: Tympanic membrane, ear canal and external ear normal. No drainage, swelling or tenderness.  No middle ear effusion. Tympanic membrane is not erythematous.     Nose: Congestion present.  No rhinorrhea.     Mouth/Throat:     Lips: Pink.     Mouth: Mucous membranes are moist.     Pharynx: No pharyngeal swelling, oropharyngeal exudate, posterior oropharyngeal erythema or uvula swelling.     Tonsils: No tonsillar exudate or tonsillar abscesses.  Eyes:     General: Vision grossly intact.     Extraocular Movements: Extraocular movements intact.     Right eye: No nystagmus.     Left eye: No nystagmus.     Conjunctiva/sclera: Conjunctivae normal.  Neck:     Trachea: Trachea normal.  Cardiovascular:     Rate and Rhythm: Normal rate.     Heart sounds: Normal heart sounds.  Pulmonary:     Effort: Pulmonary effort is normal. No tachypnea or respiratory distress.     Breath sounds: Normal breath sounds and air entry.  Abdominal:     Palpations: Abdomen is soft.  Musculoskeletal:        General: Normal range of motion.     Cervical back: Normal range of motion and neck supple. No rigidity or tenderness.  Lymphadenopathy:     Cervical: No cervical adenopathy.  Skin:    General: Skin is warm and dry.  Neurological:     General: No focal deficit present.     Mental Status: He is alert and oriented to person, place, and time.     Cranial Nerves: No cranial nerve deficit.     Sensory: Sensation is intact. No sensory deficit.     Motor: Motor function is intact. No weakness.     Coordination: Coordination is intact.     Gait: Gait is intact.   Psychiatric:        Speech: Speech normal.        Behavior: Behavior is cooperative.      UC Treatments / Results  Labs (all labs ordered are listed, but only abnormal results are displayed) Labs Reviewed  POC COVID19/FLU A&B COMBO    EKG   Radiology No results found.  Procedures Procedures (including critical care time)  Medications Ordered in UC Medications  ketorolac  (TORADOL ) injection 60 mg (60 mg Intramuscular Given 03/10/24 1148)    Initial Impression / Assessment and Plan / UC Course  I have reviewed the triage vital signs and the nursing notes.  Pertinent labs & imaging results that were available during my care of the patient were reviewed by me and considered in my medical decision making (see chart for details).     The patient presents with symptoms consistent with a viral upper respiratory infection. Flu and COVID testing was negative. Exam is reassuring and no evidence of bacterial infection or acute cardiopulmonary process is noted. Toradol  given in clinic for headache relief. Supportive care is recommended. Patient was advised to follow up with primary care if symptoms do not improve within one week or if new concerns arise. Instructions were given to seek emergency care if symptoms worsen, including shortness of breath, chest pain, persistent high fever, inability to tolerate fluids, or confusion.  Today's evaluation has revealed no signs of a dangerous process. Discussed diagnosis with patient and/or guardian. Patient and/or guardian aware of their diagnosis, possible red flag symptoms to watch out for and need for close follow up. Patient and/or guardian understands verbal and written discharge instructions. Patient and/or guardian comfortable with plan and disposition.  Patient and/or guardian has a clear mental status at this time, good insight into illness (after discussion and teaching) and has clear  judgment to make decisions regarding their  care  Documentation was completed with the aid of voice recognition software. Transcription may contain typographical errors.   Final Clinical Impressions(s) / UC Diagnoses   Final diagnoses:  Acute cough  Viral upper respiratory tract infection  Acute intractable tension-type headache     Discharge Instructions      Your COVID, and flu tests were all negative today. Your symptoms are most consistent with a viral respiratory infection affecting the nose, throat, and lungs. Please take the medications prescribed to you as directed. For nasal congestion, decongestants such as Sudafed or nasal sprays like Flonase may be helpful. Saline nasal spray or saline rinses can also be used freely to help clear mucus. Sore throat discomfort may improve with throat lozenges, warm saltwater gargles, or menthol/breathing strips. These treatments do not cure the illness but will help you feel more comfortable while your body heals. Staying hydrated is very important--drink plenty of fluids and aim for pale yellow urine. Using a cool mist humidifier, sitting in steam from a warm shower, and elevating your head when sleeping can also help with congestion and drainage. Be sure to replace your toothbrush once you start feeling better.  A cough can linger for several weeks after a respiratory infection even as other symptoms improve. This is normal as long as the cough is slowly getting better. However, if your symptoms worsen, you have trouble breathing, chest pain, new high fever, coughing up blood, or you are unable to keep fluids down, please go to the emergency room or seek medical care right away. Follow up with your primary care provider if your symptoms are not improving within one week.     ED Prescriptions     Medication Sig Dispense Auth. Provider   promethazine-dextromethorphan (PROMETHAZINE-DM) 6.25-15 MG/5ML syrup Take 10 mLs by mouth every 6 (six) hours as needed for cough. 118 mL Hervey Wedig,  Braelee Herrle, FNP   fluticasone (FLONASE) 50 MCG/ACT nasal spray Place 2 sprays into both nostrils daily. Shake well before use. Gently blow nose before spraying. Do not blow nose immediately after use. You should not taste the medication or feel it going down your throat; if you do, adjust your technique. 16 g Iola Lukes, FNP   butalbital -acetaminophen-caffeine  (FIORICET) 50-325-40 MG tablet Take 1 tablet by mouth every 6 (six) hours as needed for headache. Do not to exceed 6 tablets per 24 hour period 12 tablet Iola Lukes, FNP      PDMP not reviewed this encounter.   Iola Lukes, [place] 03/10/24 1430

## 2024-03-10 NOTE — ED Triage Notes (Addendum)
 Pt presents with complaints of cough and headaches x 3 days. Currently rates overall pain a 10/10. Pain in head is radiating down posterior neck. 1000 mg oral Tylenol taken for symptoms with minimal improvement. 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 No fevers.

## 2024-03-10 NOTE — Discharge Instructions (Addendum)
 Your COVID, and flu tests were all negative today. Your symptoms are most consistent with a viral respiratory infection affecting the nose, throat, and lungs. Please take the medications prescribed to you as directed. For nasal congestion, decongestants such as Sudafed or nasal sprays like Flonase  may be helpful. Saline nasal spray or saline rinses can also be used freely to help clear mucus. Sore throat discomfort may improve with throat lozenges, warm saltwater gargles, or menthol/breathing strips. These treatments do not cure the illness but will help you feel more comfortable while your body heals. Staying hydrated is very important--drink plenty of fluids and aim for pale yellow urine. Using a cool mist humidifier, sitting in steam from a warm shower, and elevating your head when sleeping can also help with congestion and drainage. Be sure to replace your toothbrush once you start feeling better.  A cough can linger for several weeks after a respiratory infection even as other symptoms improve. This is normal as long as the cough is slowly getting better. However, if your symptoms worsen, you have trouble breathing, chest pain, new high fever, coughing up blood, or you are unable to keep fluids down, please go to the emergency room or seek medical care right away. Follow up with your primary care provider if your symptoms are not improving within one week.
